# Patient Record
Sex: Female | Born: 1955
Health system: Southern US, Community
[De-identification: ages and names within clinical notes are randomized; demographics above are authoritative.]

## PROBLEM LIST (undated history)

## (undated) DIAGNOSIS — I6529 Occlusion and stenosis of unspecified carotid artery: Secondary | ICD-10-CM

## (undated) DIAGNOSIS — N951 Menopausal and female climacteric states: Secondary | ICD-10-CM

## (undated) DIAGNOSIS — Z8673 Personal history of transient ischemic attack (TIA), and cerebral infarction without residual deficits: Secondary | ICD-10-CM

## (undated) DIAGNOSIS — N200 Calculus of kidney: Secondary | ICD-10-CM

## (undated) DIAGNOSIS — Z8742 Personal history of other diseases of the female genital tract: Secondary | ICD-10-CM

## (undated) DIAGNOSIS — M791 Myalgia, unspecified site: Secondary | ICD-10-CM

## (undated) DIAGNOSIS — M85851 Other specified disorders of bone density and structure, right thigh: Secondary | ICD-10-CM

## (undated) DIAGNOSIS — K589 Irritable bowel syndrome without diarrhea: Secondary | ICD-10-CM

## (undated) DIAGNOSIS — E559 Vitamin D deficiency, unspecified: Secondary | ICD-10-CM

## (undated) DIAGNOSIS — R740 Nonspecific elevation of levels of transaminase and lactic acid dehydrogenase [LDH]: Secondary | ICD-10-CM

## (undated) DIAGNOSIS — R112 Nausea with vomiting, unspecified: Secondary | ICD-10-CM

## (undated) DIAGNOSIS — K219 Gastro-esophageal reflux disease without esophagitis: Secondary | ICD-10-CM

## (undated) DIAGNOSIS — K760 Fatty (change of) liver, not elsewhere classified: Secondary | ICD-10-CM

## (undated) DIAGNOSIS — E119 Type 2 diabetes mellitus without complications: Secondary | ICD-10-CM

## (undated) DIAGNOSIS — R9431 Abnormal electrocardiogram [ECG] [EKG]: Secondary | ICD-10-CM

## (undated) DIAGNOSIS — I10 Essential (primary) hypertension: Secondary | ICD-10-CM

## (undated) DIAGNOSIS — E781 Pure hyperglyceridemia: Secondary | ICD-10-CM

## (undated) DIAGNOSIS — E7800 Pure hypercholesterolemia, unspecified: Secondary | ICD-10-CM

## (undated) DIAGNOSIS — Z87442 Personal history of urinary calculi: Secondary | ICD-10-CM

## (undated) DIAGNOSIS — R7989 Other specified abnormal findings of blood chemistry: Secondary | ICD-10-CM

## (undated) DIAGNOSIS — R7309 Other abnormal glucose: Secondary | ICD-10-CM

## (undated) DIAGNOSIS — Z9889 Other specified postprocedural states: Secondary | ICD-10-CM

## (undated) DIAGNOSIS — N3010 Interstitial cystitis (chronic) without hematuria: Secondary | ICD-10-CM

## (undated) DIAGNOSIS — I700 Atherosclerosis of aorta: Secondary | ICD-10-CM

## (undated) DIAGNOSIS — T8859XA Other complications of anesthesia, initial encounter: Secondary | ICD-10-CM

## (undated) DIAGNOSIS — R160 Hepatomegaly, not elsewhere classified: Secondary | ICD-10-CM

## (undated) DIAGNOSIS — R079 Chest pain, unspecified: Secondary | ICD-10-CM

## (undated) DIAGNOSIS — F419 Anxiety disorder, unspecified: Secondary | ICD-10-CM

## (undated) DIAGNOSIS — R7401 Elevation of levels of liver transaminase levels: Secondary | ICD-10-CM

## (undated) DIAGNOSIS — L80 Vitiligo: Secondary | ICD-10-CM

## (undated) DIAGNOSIS — M2550 Pain in unspecified joint: Secondary | ICD-10-CM

## (undated) DIAGNOSIS — T4145XA Adverse effect of unspecified anesthetic, initial encounter: Secondary | ICD-10-CM

## (undated) HISTORY — DX: Irritable bowel syndrome, unspecified: K58.9

## (undated) HISTORY — DX: Elevation of levels of liver transaminase levels: R74.01

## (undated) HISTORY — DX: Pain in unspecified joint: M25.50

## (undated) HISTORY — DX: Other specified abnormal findings of blood chemistry: R79.89

## (undated) HISTORY — DX: Personal history of other diseases of the female genital tract: Z87.42

## (undated) HISTORY — DX: Myalgia, unspecified site: M79.10

## (undated) HISTORY — DX: Calculus of kidney: N20.0

## (undated) HISTORY — DX: Other abnormal glucose: R73.09

## (undated) HISTORY — DX: Pure hypercholesterolemia, unspecified: E78.00

## (undated) HISTORY — DX: Hepatomegaly, not elsewhere classified: R16.0

## (undated) HISTORY — DX: Vitiligo: L80

## (undated) HISTORY — DX: Personal history of transient ischemic attack (TIA), and cerebral infarction without residual deficits: Z86.73

## (undated) HISTORY — DX: Vitamin D deficiency, unspecified: E55.9

## (undated) HISTORY — DX: Other specified disorders of bone density and structure, right thigh: M85.851

## (undated) HISTORY — DX: Pure hyperglyceridemia: E78.1

## (undated) HISTORY — DX: Nonspecific elevation of levels of transaminase and lactic acid dehydrogenase (ldh): R74.0

## (undated) HISTORY — DX: Type 2 diabetes mellitus without complications: E11.9

## (undated) HISTORY — DX: Occlusion and stenosis of unspecified carotid artery: I65.29

## (undated) HISTORY — DX: Essential (primary) hypertension: I10

## (undated) HISTORY — PX: CATARACT EXTRACTION, BILATERAL: SHX1313

## (undated) HISTORY — DX: Abnormal electrocardiogram (ECG) (EKG): R94.31

## (undated) HISTORY — PX: ESOPHAGOGASTRODUODENOSCOPY: SHX1529

## (undated) HISTORY — DX: Interstitial cystitis (chronic) without hematuria: N30.10

## (undated) HISTORY — DX: Menopausal and female climacteric states: N95.1

## (undated) HISTORY — DX: Atherosclerosis of aorta: I70.0

## (undated) HISTORY — DX: Chest pain, unspecified: R07.9

## (undated) HISTORY — DX: Fatty (change of) liver, not elsewhere classified: K76.0

---

## 1982-10-04 HISTORY — PX: ABDOMINAL HYSTERECTOMY: SHX81

## 1999-02-06 ENCOUNTER — Ambulatory Visit (HOSPITAL_COMMUNITY): Admission: RE | Admit: 1999-02-06 | Discharge: 1999-02-06 | Payer: Self-pay | Admitting: Gastroenterology

## 1999-02-06 ENCOUNTER — Encounter: Payer: Self-pay | Admitting: Gastroenterology

## 1999-08-18 ENCOUNTER — Ambulatory Visit (HOSPITAL_COMMUNITY): Admission: RE | Admit: 1999-08-18 | Discharge: 1999-08-18 | Payer: Self-pay | Admitting: Obstetrics and Gynecology

## 1999-08-18 ENCOUNTER — Encounter: Payer: Self-pay | Admitting: Obstetrics and Gynecology

## 1999-08-31 ENCOUNTER — Other Ambulatory Visit: Admission: RE | Admit: 1999-08-31 | Discharge: 1999-08-31 | Payer: Self-pay | Admitting: Gynecology

## 2000-08-19 ENCOUNTER — Encounter: Payer: Self-pay | Admitting: Obstetrics and Gynecology

## 2000-08-19 ENCOUNTER — Ambulatory Visit (HOSPITAL_COMMUNITY): Admission: RE | Admit: 2000-08-19 | Discharge: 2000-08-19 | Payer: Self-pay | Admitting: Obstetrics and Gynecology

## 2000-08-29 ENCOUNTER — Other Ambulatory Visit: Admission: RE | Admit: 2000-08-29 | Discharge: 2000-08-29 | Payer: Self-pay | Admitting: Gynecology

## 2001-03-30 ENCOUNTER — Ambulatory Visit (HOSPITAL_COMMUNITY): Admission: RE | Admit: 2001-03-30 | Discharge: 2001-03-30 | Payer: Self-pay | Admitting: Gastroenterology

## 2001-08-25 ENCOUNTER — Encounter: Payer: Self-pay | Admitting: Gynecology

## 2001-08-25 ENCOUNTER — Ambulatory Visit (HOSPITAL_COMMUNITY): Admission: RE | Admit: 2001-08-25 | Discharge: 2001-08-25 | Payer: Self-pay | Admitting: Gynecology

## 2001-09-05 ENCOUNTER — Other Ambulatory Visit: Admission: RE | Admit: 2001-09-05 | Discharge: 2001-09-05 | Payer: Self-pay | Admitting: Gynecology

## 2001-10-12 ENCOUNTER — Encounter: Payer: Self-pay | Admitting: Gastroenterology

## 2001-10-12 ENCOUNTER — Encounter: Admission: RE | Admit: 2001-10-12 | Discharge: 2001-10-12 | Payer: Self-pay | Admitting: Gastroenterology

## 2002-01-18 ENCOUNTER — Ambulatory Visit (HOSPITAL_BASED_OUTPATIENT_CLINIC_OR_DEPARTMENT_OTHER): Admission: RE | Admit: 2002-01-18 | Discharge: 2002-01-18 | Payer: Self-pay | Admitting: Gynecology

## 2002-03-02 ENCOUNTER — Encounter: Payer: Self-pay | Admitting: Surgery

## 2002-03-02 ENCOUNTER — Emergency Department (HOSPITAL_COMMUNITY): Admission: EM | Admit: 2002-03-02 | Discharge: 2002-03-02 | Payer: Self-pay | Admitting: Emergency Medicine

## 2002-08-27 ENCOUNTER — Ambulatory Visit (HOSPITAL_COMMUNITY): Admission: RE | Admit: 2002-08-27 | Discharge: 2002-08-27 | Payer: Self-pay | Admitting: Gynecology

## 2002-08-27 ENCOUNTER — Encounter: Payer: Self-pay | Admitting: Gynecology

## 2002-09-03 ENCOUNTER — Other Ambulatory Visit: Admission: RE | Admit: 2002-09-03 | Discharge: 2002-09-03 | Payer: Self-pay | Admitting: Gynecology

## 2002-11-30 ENCOUNTER — Encounter: Admission: RE | Admit: 2002-11-30 | Discharge: 2002-11-30 | Payer: Self-pay | Admitting: Gastroenterology

## 2002-11-30 ENCOUNTER — Encounter: Payer: Self-pay | Admitting: Gastroenterology

## 2002-12-07 ENCOUNTER — Ambulatory Visit (HOSPITAL_COMMUNITY): Admission: RE | Admit: 2002-12-07 | Discharge: 2002-12-07 | Payer: Self-pay | Admitting: Gastroenterology

## 2002-12-07 ENCOUNTER — Encounter (INDEPENDENT_AMBULATORY_CARE_PROVIDER_SITE_OTHER): Payer: Self-pay | Admitting: Specialist

## 2003-09-02 ENCOUNTER — Other Ambulatory Visit: Admission: RE | Admit: 2003-09-02 | Discharge: 2003-09-02 | Payer: Self-pay | Admitting: Gynecology

## 2003-09-02 ENCOUNTER — Ambulatory Visit (HOSPITAL_COMMUNITY): Admission: RE | Admit: 2003-09-02 | Discharge: 2003-09-02 | Payer: Self-pay | Admitting: Gynecology

## 2003-10-31 ENCOUNTER — Encounter: Admission: RE | Admit: 2003-10-31 | Discharge: 2003-10-31 | Payer: Self-pay | Admitting: Gastroenterology

## 2004-09-14 ENCOUNTER — Other Ambulatory Visit: Admission: RE | Admit: 2004-09-14 | Discharge: 2004-09-14 | Payer: Self-pay | Admitting: Gynecology

## 2004-09-14 ENCOUNTER — Ambulatory Visit (HOSPITAL_COMMUNITY): Admission: RE | Admit: 2004-09-14 | Discharge: 2004-09-14 | Payer: Self-pay | Admitting: Gynecology

## 2005-09-15 ENCOUNTER — Ambulatory Visit (HOSPITAL_COMMUNITY): Admission: RE | Admit: 2005-09-15 | Discharge: 2005-09-15 | Payer: Self-pay | Admitting: Gynecology

## 2005-09-15 ENCOUNTER — Other Ambulatory Visit: Admission: RE | Admit: 2005-09-15 | Discharge: 2005-09-15 | Payer: Self-pay | Admitting: Gynecology

## 2005-10-19 ENCOUNTER — Encounter: Admission: RE | Admit: 2005-10-19 | Discharge: 2005-10-19 | Payer: Self-pay | Admitting: Gastroenterology

## 2005-12-14 ENCOUNTER — Encounter: Admission: RE | Admit: 2005-12-14 | Discharge: 2005-12-14 | Payer: Self-pay | Admitting: Gastroenterology

## 2006-09-15 ENCOUNTER — Other Ambulatory Visit: Admission: RE | Admit: 2006-09-15 | Discharge: 2006-09-15 | Payer: Self-pay | Admitting: Internal Medicine

## 2006-09-16 ENCOUNTER — Encounter: Admission: RE | Admit: 2006-09-16 | Discharge: 2006-09-16 | Payer: Self-pay | Admitting: Gastroenterology

## 2007-09-19 ENCOUNTER — Ambulatory Visit (HOSPITAL_COMMUNITY): Admission: RE | Admit: 2007-09-19 | Discharge: 2007-09-19 | Payer: Self-pay | Admitting: Obstetrics and Gynecology

## 2007-09-22 ENCOUNTER — Other Ambulatory Visit: Admission: RE | Admit: 2007-09-22 | Discharge: 2007-09-22 | Payer: Self-pay | Admitting: Obstetrics and Gynecology

## 2008-09-19 ENCOUNTER — Ambulatory Visit (HOSPITAL_COMMUNITY): Admission: RE | Admit: 2008-09-19 | Discharge: 2008-09-19 | Payer: Self-pay | Admitting: Gynecology

## 2008-10-11 ENCOUNTER — Other Ambulatory Visit: Admission: RE | Admit: 2008-10-11 | Discharge: 2008-10-11 | Payer: Self-pay | Admitting: Obstetrics and Gynecology

## 2009-09-24 ENCOUNTER — Ambulatory Visit (HOSPITAL_COMMUNITY): Admission: RE | Admit: 2009-09-24 | Discharge: 2009-09-24 | Payer: Self-pay | Admitting: Gynecology

## 2009-10-16 ENCOUNTER — Other Ambulatory Visit: Admission: RE | Admit: 2009-10-16 | Discharge: 2009-10-16 | Payer: Self-pay | Admitting: Obstetrics and Gynecology

## 2009-12-15 ENCOUNTER — Encounter: Admission: RE | Admit: 2009-12-15 | Discharge: 2009-12-15 | Payer: Self-pay | Admitting: Gastroenterology

## 2010-03-20 ENCOUNTER — Encounter: Admission: RE | Admit: 2010-03-20 | Discharge: 2010-03-20 | Payer: Self-pay | Admitting: Gastroenterology

## 2010-09-29 ENCOUNTER — Ambulatory Visit (HOSPITAL_COMMUNITY)
Admission: RE | Admit: 2010-09-29 | Discharge: 2010-09-29 | Payer: Self-pay | Source: Home / Self Care | Attending: Obstetrics and Gynecology | Admitting: Obstetrics and Gynecology

## 2010-10-25 ENCOUNTER — Encounter: Payer: Self-pay | Admitting: Gynecology

## 2011-02-19 NOTE — Op Note (Signed)
NAME:  Annette Bartlett, Annette Bartlett                          ACCOUNT NO.:  [id number]   MEDICAL RECORD NO.:  [id number]                   PATIENT TYPE:  AMB   LOCATION:  ENDO                                 FACILITY:  [facility]   PHYSICIAN:  Danise Edge, M.D.                DATE OF BIRTH:  Nov 19, 1955   DATE OF PROCEDURE:  12/07/2002  DATE OF DISCHARGE:                                 OPERATIVE REPORT   PROCEDURE:  Colonoscopy.   INDICATIONS:  The patient is a 55 year old female born 2056-07-03.  The patient  has unexplained left-sided abdominal pain associated with chronic irritable  bowel syndrome and chronic interstitial cystitis.   The patient has received a number of courses of Keflex and Cipro attempting  to eradicate her staphylococcal cystitis.  She was recently seen in  the  emergency room by Molli Hazard B. Daphine Deutscher, M.D., who suspected that she might have  nephrolithiasis.  She was seen in consultation by Veverly Fells. Vernie Ammons, M.D.  The  patient tells me that her renal ultrasound did not show kidney stones.  The  ultrasound did show hydronephrosis of the right kidney.   The patient has episodes of nausea, epigastric discomfort, left-sided  abdominal discomfort, and three to four episodes of watery, nonbloody  diarrhea during the daytime without nocturnal diarrhea.   March 1989, air-contrast barium enema was normal.  January 1992, small bowel  follow-through x-ray series was normal.  February 1992 diagnostic  laparoscopy performed by Gretta Cool, M.D., was normal, status post  total abdominal hysterectomy-bilateral salpingo-oophorectomy, which was  performed for endometriosis.   February 1998, intravenous pyelogram was normal.  April 1998, CT scan of the  abdomen was normal.  May 1998, proctocolonoscopy to the cecum was normal.  June 2002, esophagogastroduodenoscopy was normal.  February 2002, cardiac  evaluation was normal.  January 2003, CT scan of the abdomen and pelvis was  normal.   The patient continues on Keflex; her last urine culture showed 25,000  colonies of Staphylococcus.   MEDICATION ALLERGIES:  SULFA.   PAST MEDICAL HISTORY:  1. Total abdominal hysterectomy with BSO for endometriosis.  2. Normal diagnostic laparoscopy in 1992.  3. Chronic irritable bowel syndrome.  4. Chronic interstitial cystitis.  5. CT scan of the abdomen and pelvis performed 11/30/02 revealed resolution     of the right renal hydronephrosis, otherwise normal exam post TAH-BSO.   ENDOSCOPIST:  Danise Edge, M.D.   PREMEDICATION:  Versed 10 mg, Demerol 100 mg.   ENDOSCOPE:  Olympus pediatric colonoscope.   DESCRIPTION OF PROCEDURE:  After obtaining informed consent, the patient was  placed in the left lateral decubitus position.  I administered intravenous  Demerol and intravenous Versed to achieve conscious sedation for the  procedure.  The patient's blood pressure, oxygen saturation, and cardiac  rhythm were monitored throughout the procedure and documented in the medical  record.   Anal inspection was normal.  Digital rectal exam was normal.  The Olympus  pediatric video colonoscope was introduced into the rectum and easily  advanced to the cecum.  Colonic preparation for the exam today was  excellent.   Rectum:  Two 0.5 mm diminutive smooth polyps were removed from the distal  rectum with the cold biopsy forceps.   Sigmoid colon and descending colon normal.   Splenic flexure normal.   Transverse colon normal.   Hepatic flexure normal.   Ascending colon normal.   Cecum and ileocecal valve normal.   Random colonic biopsies:  Three biopsies were taken from the right colon and  three biopsies were taken from the left colon.  All biopsies were submitted  in one bottle to rule out collagenous-microscopic colitis.   ASSESSMENT:  1. Two diminutive polyps were removed from the distal rectum.  2. Random colonic biopsies to rule out microscopic-collagenous colitis      pending.  3. Otherwise normal proctocolonoscopy to the cecum without signs of     inflammatory bowel disease, colorectal neoplasia.                                               Danise Edge, M.D.    MJ/MEDQ  D:  12/07/2002  T:  12/08/2002  Job:  [id number]   cc:   Veverly Fells. Vernie Ammons, M.D.  [address]  [place]  [place] [address]  Fax: [phone]   Gretta Cool, M.D.  [address] [place]  [place] [address]  Fax: [phone]   [place]. Daphine Deutscher, M.D.  [address]  [place]  [place] [address]  Fax: [phone]   [address], [place] [facility]

## 2011-02-19 NOTE — Op Note (Signed)
[facility]  Patient:    Annette Bartlett, Annette Bartlett Visit Number: [id number] MRN: [id number]          Service Type: NES Location: [facility] Attending Physician:  Katrina Stack Dictated by:   Gretta Cool, M.D. Proc. Date: 01/18/02 Admit Date:  01/18/2002                             Operative Report  PREOPERATIVE DIAGNOSIS: 1. Incapacitating pelvic pain, noncyclic, with history of irritable bowel    syndrome and interstitial cystitis. 2. Stage III endometriosis, post total abdominal hysterectomy and    bilateral salpingo-oophorectomy and second-look laparoscopy in 1992.  POSTOPERATIVE DIAGNOSIS:  No evidence of source of pelvic pain except possible small sigmoid adhesion to the left lateral pelvic wall at the pelvic brim.  PROCEDURES: 1. Diagnostic laparoscopy, surveillance of the entire peritoneal cavity,    photographs of the pelvis and upper abdomen. 2. lysis of sigmoid adhesion to the site of resection of round ligament.  SURGEON:  Gretta Cool, M.D.  DESCRIPTION OF PROCEDURE:  Under excellent general orotracheal anesthesia with the patients abdomen prepped and draped as a sterile field with her bladder drained, a subumbilical incision was made and the Veress cannula introduced. After adequate pneumoperitoneum with carbon dioxide, laparoscope trocar was introduced and pelvic organs visualized.  Accessory port sites were placed far lateral in the abdomen under direct vision without complication.  The port sites were then used for retraction to help and aid in the visualization of the entire pelvis and upper abdomen.  There were no abnormalities identified except for some adhesion of the ascending colon above the pelvic brim to near the base of the liver.  The adhesions appeared to be very filmy and had no likely association with her left lower quadrant pain.  On the left she had an adhesion of the sigmoid colon to the area of the left round  ligament resection with tenting of the sigmoid colon.  The adhesion was very filmy and relatively avascular.  It was resected and to allow the colon to fall away to the pelvic brim.  There was no evidence of residual endometriosis or other intra-abdominal pathology.  The cecum was normal.  The appendix could not be visualized.  The distal ileum appeared normal.  At this point the area of resection of adhesions was examined again, and there was no significant bleeding.  The procedure was then terminated without complication.  The patient returned to the recovery room in excellent condition.  Note the incisions were closed with deep suture of 5-0 Vicryl and skin closed with Steri-Strips. Dictated by:   Gretta Cool, M.D. Attending Physician:  Katrina Stack DD:  01/18/02 TD:  01/19/02 Job: [id number]

## 2011-02-19 NOTE — Procedures (Signed)
[facility]  Patient:    Annette Bartlett, Annette Bartlett                         MRN: [id number] Proc. Date: 03/30/01 Attending:  Verlin Grills, M.D. CC:         Armanda Magic, M.D.   Procedure Report  PROCEDURE:  Esophagogastroduodenoscopy.  REFERRING PHYSICIAN:  Armanda Magic, M.D.  INDICATIONS FOR PROCEDURE:  The patient (date of birth 06/18/1956) is a 55 year old female having bouts of "atypical" anterior chest pain unassociated with dysphagia, odynophagia, or heartburn.  In 1991, her cardiac echo suggested mitral valve prolapse.  In 2000, her cardiac echo did not reveal mitral valve prolapse.  ENDOSCOPIST:  Verlin Grills, M.D.  PREMEDICATION:  Versed 10 mg and Demerol 50 mg.  ENDOSCOPE:  Olympus gastroscope.  DESCRIPTION OF PROCEDURE:  After obtaining informed consent, the patient was placed in the left lateral decubitus position.  I administered intravenous Demerol and intravenous Versed to achieve conscious sedation for the procedure.  The patients blood pressure, oxygen saturation, and cardiac rhythm were monitored throughout the procedure and documented in the medical record.  The Olympus gastroscope was passed through the posterior hypopharynx into the proximal esophagus without difficulty.  The hypopharynx, larynx, and vocal cords appeared normal  Esophagoscopy:  The proximal, mid, and lower segment of the esophagus appeared completely normal.  The squamocolumnar junction and the esophagogastric junction are noted at approximately 40 cm from the incisor teeth. Endoscopically, there is no evidence for the presence of Barretts esophagus, erosive esophagitis, esophageal mucosal scarring, or esophageal ulceration.  Gastroscopy:  Retroflexed view of the gastric cardia and fundus was normal. The gastric body, antrum, and pylorus appeared normal.  Duodenoscopy:  The duodenal bulb, mid duodenum, and distal duodenum  appeared normal.  ASSESSMENT:  Normal esophagogastroduodenoscopy.  RECOMMENDATIONS:  If the patient is having bouts of chest pain due to "esophageal spasm", I would recommend p.r.n. sublingual nitroglycerin to control her bouts of chest pain.  I see no good indication for further gastrointestinal evaluation. DD:  03/30/01 TD:  03/30/01 Job: [id number]

## 2011-07-29 ENCOUNTER — Other Ambulatory Visit: Payer: Self-pay | Admitting: Internal Medicine

## 2011-07-30 ENCOUNTER — Ambulatory Visit
Admission: RE | Admit: 2011-07-30 | Discharge: 2011-07-30 | Disposition: A | Discharge status: Home / Self Care | Payer: PRIVATE HEALTH INSURANCE | Source: Ambulatory Visit | Attending: Internal Medicine | Admitting: Internal Medicine

## 2011-07-30 MED ORDER — IOHEXOL 300 MG/ML  SOLN: 75.0000 mL | Freq: Once | INTRAMUSCULAR | Status: AC | PRN

## 2011-08-24 ENCOUNTER — Other Ambulatory Visit (HOSPITAL_COMMUNITY): Payer: Self-pay | Admitting: Internal Medicine

## 2011-08-24 DIAGNOSIS — Z1231 Encounter for screening mammogram for malignant neoplasm of breast: Secondary | ICD-10-CM

## 2011-10-05 DIAGNOSIS — N200 Calculus of kidney: Secondary | ICD-10-CM

## 2011-10-05 HISTORY — DX: Calculus of kidney: N20.0

## 2011-10-07 ENCOUNTER — Ambulatory Visit (HOSPITAL_COMMUNITY)
Admission: RE | Admit: 2011-10-07 | Discharge: 2011-10-07 | Disposition: A | Discharge status: Home / Self Care | Payer: PRIVATE HEALTH INSURANCE | Source: Ambulatory Visit | Attending: Internal Medicine | Admitting: Internal Medicine

## 2011-10-07 DIAGNOSIS — Z1231 Encounter for screening mammogram for malignant neoplasm of breast: Secondary | ICD-10-CM

## 2012-03-30 ENCOUNTER — Other Ambulatory Visit: Payer: Self-pay | Admitting: Internal Medicine

## 2012-03-30 DIAGNOSIS — R109 Unspecified abdominal pain: Secondary | ICD-10-CM

## 2012-04-04 ENCOUNTER — Ambulatory Visit
Admission: RE | Admit: 2012-04-04 | Discharge: 2012-04-04 | Disposition: A | Discharge status: Home / Self Care | Payer: BC Managed Care – PPO | Source: Ambulatory Visit | Attending: Internal Medicine | Admitting: Internal Medicine

## 2012-04-04 DIAGNOSIS — R109 Unspecified abdominal pain: Secondary | ICD-10-CM

## 2012-04-04 MED ORDER — IOHEXOL 300 MG/ML  SOLN
100.0000 mL | Freq: Once | INTRAMUSCULAR | Status: AC | PRN
  Administered 2012-04-04: 100 mL via INTRAVENOUS

## 2012-06-01 ENCOUNTER — Other Ambulatory Visit: Payer: Self-pay | Admitting: Gastroenterology

## 2012-06-01 DIAGNOSIS — R1906 Epigastric swelling, mass or lump: Secondary | ICD-10-CM

## 2012-06-07 ENCOUNTER — Other Ambulatory Visit: Payer: Self-pay | Admitting: Gastroenterology

## 2012-06-07 ENCOUNTER — Ambulatory Visit
Admission: RE | Admit: 2012-06-07 | Discharge: 2012-06-07 | Disposition: A | Discharge status: Home / Self Care | Payer: BC Managed Care – PPO | Source: Ambulatory Visit | Attending: Gastroenterology | Admitting: Gastroenterology

## 2012-06-07 DIAGNOSIS — R1906 Epigastric swelling, mass or lump: Secondary | ICD-10-CM

## 2012-09-07 ENCOUNTER — Other Ambulatory Visit (HOSPITAL_COMMUNITY): Payer: Self-pay | Admitting: Internal Medicine

## 2012-09-07 DIAGNOSIS — Z1231 Encounter for screening mammogram for malignant neoplasm of breast: Secondary | ICD-10-CM

## 2012-10-10 ENCOUNTER — Ambulatory Visit (HOSPITAL_COMMUNITY)
Admission: RE | Admit: 2012-10-10 | Discharge: 2012-10-10 | Disposition: A | Discharge status: Home / Self Care | Payer: BC Managed Care – PPO | Source: Ambulatory Visit | Attending: Internal Medicine | Admitting: Internal Medicine

## 2012-10-10 DIAGNOSIS — Z1231 Encounter for screening mammogram for malignant neoplasm of breast: Secondary | ICD-10-CM | POA: Insufficient documentation

## 2013-06-11 ENCOUNTER — Other Ambulatory Visit (HOSPITAL_COMMUNITY)
Admission: RE | Admit: 2013-06-11 | Discharge: 2013-06-11 | Disposition: A | Discharge status: Home / Self Care | Payer: BC Managed Care – PPO | Source: Ambulatory Visit | Attending: Internal Medicine | Admitting: Internal Medicine

## 2013-06-11 ENCOUNTER — Other Ambulatory Visit: Payer: Self-pay | Admitting: Internal Medicine

## 2013-06-11 DIAGNOSIS — Z01419 Encounter for gynecological examination (general) (routine) without abnormal findings: Secondary | ICD-10-CM | POA: Insufficient documentation

## 2013-08-04 DIAGNOSIS — Z87442 Personal history of urinary calculi: Secondary | ICD-10-CM

## 2013-08-04 HISTORY — DX: Personal history of urinary calculi: Z87.442

## 2013-08-08 ENCOUNTER — Other Ambulatory Visit: Payer: Self-pay | Admitting: Urology

## 2013-08-13 ENCOUNTER — Encounter (HOSPITAL_COMMUNITY): Payer: Self-pay | Admitting: Pharmacy Technician

## 2013-08-14 ENCOUNTER — Encounter (HOSPITAL_COMMUNITY): Payer: Self-pay | Admitting: *Deleted

## 2013-08-17 NOTE — H&P (Signed)
Annette Bartlett is a 57 year old female patient with a right renal calculus.   History of Present Illness Interstitial cystitis: She was diagnosed with this in 4/98 at which time in office cystoscopy revealed a 300 cc capacity bladder and bloody terminal efluent. She was treated with intravesical therapy over the years.    Right nephrolithiasis: She had no stones seen on a CT scan in 6/03 and a 1 mm stone in the lower pole of the right kidney on CT scan in 2004 and 2005. In 7/14 a CT scan 700.revealed a 4 x 5 mm stone in the right renal pelvis with Hounsfield units of ~ 700.    Interval hx: About one week ago she began to have gross hematuria. This was associated with some discomfort in the right flank region. It was not associated with any nausea or vomiting. She has not seen any further hematuria and tells me that she is still experiencing some discomfort on the right side. She has not seen a stone pass. Her pain is not relieved by positional change. No modifying factors or other associated signs and symptoms.     Past Medical History Problems  1. History of Anxiety (300.00) 2. History of Chronic interstitial cystitis without hematuria (595.1) 3. History of hypertension (V12.59) 4. History of irritable bowel syndrome (V12.79)  Surgical History Problems  1. History of Hysterectomy  Current Meds 1. Escitalopram Oxalate 10 MG Oral Tablet;  Therapy: 13Nov2013 to Recorded 2. Premarin 0.45 MG Oral Tablet;  Therapy: 05Jul2012 to Recorded 3. Rapaflo 8 MG Oral Capsule; TAKE 1 CAPSULE DAILY WITH FOOD;  Therapy: 15Aug2014 to (Evaluate:14Oct2014); Last Rx:15Aug2014 Ordered 4. TraZODone HCl - 50 MG Oral Tablet;  Therapy: 28Jun2012 to Recorded  Allergies Medication  1. Macrobid CAPS 2. Sulfa Drugs  Family History Problems  1. Family history of Blood In Urine : Son 2. Family history of Breast Cancer (V16.3) : Mother 3. Family history of Family Health Status - Father's Age   age 52 4.  Family history of Family Health Status - Mother's Age   age 90 5. Family history of Family Health Status Number Of Children   1 son 6. Family history of Hypertension (V17.49) 7. Family history of Parkinson's Disease : Father 8. Family history of Prostate Cancer (A54.09) : Father 82. Family history of Stroke Syndrome (V17.1) : Brother  Social History Problems  1. Denied: History of Alcohol Use 2. Caffeine Use   2 per day 3. Marital History - Widowed 4. Never A Smoker 5. Occupation:   Medical Records  Review of Systems Genitourinary, constitutional, skin, eye, otolaryngeal, hematologic/lymphatic, cardiovascular, pulmonary, endocrine, musculoskeletal, gastrointestinal, neurological and psychiatric system(s) were reviewed and pertinent findings if present are noted.  Genitourinary: hematuria.  Gastrointestinal: flank pain.  Constitutional: no fever.    Vitals Vital Signs   Height: 5 ft 4 in Weight: 132 lb  BMI Calculated: 22.66 BSA Calculated: 1.64 Blood Pressure: 129 / 69 Temperature: 97.3 F Heart Rate: 83  Physical Exam Constitutional: Well nourished and well developed . No acute distress. The patient appears well hydrated.  ENT:. The ears and nose are normal in appearance.  Neck: The appearance of the neck is normal.  Pulmonary: No respiratory distress.  Cardiovascular: Heart rate and rhythm are normal.  Abdomen: The abdomen is flat. The abdomen is soft and nontender. No suprapubic tenderness, no tenderness in the RLQ and no LLQ tenderness. No CVA tenderness. No hernias are palpable.  Genitourinary:. Deferred.  Skin: Normal skin turgor and normal  skin color and pigmentation.  Neuro/Psych:. Mood and affect are appropriate.   Assessment Assessed  1. Right ureteral calculus (592.1)  Her stone is again seen on KUB located at the UPJ on the right-hand side. We therefore discussed surgical management. I discussed ureteroscopy and lithotripsy with her in detail and we  went over the pluses and minuses of each of these procedures. The stones location and density would lend itself to lithotripsy nicely and I therefore went over that procedure with her including the probability of success, the outpatient nature of the procedure, anticipated postoperative course and the risks and complications. She understands and has elected to proceed. I am going to continue her on medical expulsive therapy.     Plan   1. Continue medical expulsive therapy with tamsulosin.  2. She will be scheduled for lithotripsy.

## 2013-08-20 ENCOUNTER — Ambulatory Visit (HOSPITAL_COMMUNITY): Payer: BC Managed Care – PPO

## 2013-08-20 ENCOUNTER — Ambulatory Visit (HOSPITAL_COMMUNITY)
Admission: RE | Admit: 2013-08-20 | Discharge: 2013-08-20 | Disposition: A | Discharge status: Home / Self Care | Payer: BC Managed Care – PPO | Source: Ambulatory Visit | Attending: Urology | Admitting: Urology

## 2013-08-20 ENCOUNTER — Encounter (HOSPITAL_COMMUNITY): Payer: Self-pay | Admitting: *Deleted

## 2013-08-20 ENCOUNTER — Encounter (HOSPITAL_COMMUNITY)
Admission: RE | Disposition: A | Discharge status: Home / Self Care | Payer: Self-pay | Source: Ambulatory Visit | Attending: Urology

## 2013-08-20 DIAGNOSIS — N200 Calculus of kidney: Secondary | ICD-10-CM | POA: Diagnosis present

## 2013-08-20 DIAGNOSIS — I10 Essential (primary) hypertension: Secondary | ICD-10-CM | POA: Insufficient documentation

## 2013-08-20 HISTORY — DX: Other complications of anesthesia, initial encounter: T88.59XA

## 2013-08-20 HISTORY — DX: Adverse effect of unspecified anesthetic, initial encounter: T41.45XA

## 2013-08-20 HISTORY — DX: Anxiety disorder, unspecified: F41.9

## 2013-08-20 HISTORY — DX: Other specified postprocedural states: R11.2

## 2013-08-20 HISTORY — DX: Other specified postprocedural states: Z98.890

## 2013-08-20 HISTORY — DX: Personal history of urinary calculi: Z87.442

## 2013-08-20 SURGERY — LITHOTRIPSY, ESWL
Anesthesia: LOCAL | Laterality: Right

## 2013-08-20 MED ORDER — DIAZEPAM 5 MG PO TABS
10.0000 mg | ORAL_TABLET | ORAL | Status: AC
  Administered 2013-08-20: 10 mg via ORAL
  Filled 2013-08-20: qty 2

## 2013-08-20 MED ORDER — TAMSULOSIN HCL 0.4 MG PO CAPS: 0.4000 mg | ORAL_CAPSULE | ORAL | Status: DC

## 2013-08-20 MED ORDER — OXYCODONE-ACETAMINOPHEN 10-325 MG PO TABS: 1.0000 | ORAL_TABLET | ORAL | Status: DC | PRN

## 2013-08-20 MED ORDER — DIPHENHYDRAMINE HCL 25 MG PO CAPS
25.0000 mg | ORAL_CAPSULE | ORAL | Status: AC
  Administered 2013-08-20: 25 mg via ORAL
  Filled 2013-08-20: qty 1

## 2013-08-20 MED ORDER — CIPROFLOXACIN HCL 500 MG PO TABS
500.0000 mg | ORAL_TABLET | ORAL | Status: AC
  Administered 2013-08-20: 500 mg via ORAL
  Filled 2013-08-20: qty 1

## 2013-08-20 MED ORDER — TAMSULOSIN HCL 0.4 MG PO CAPS: 0.4000 mg | ORAL_CAPSULE | Freq: Once | ORAL | Status: DC

## 2013-08-20 MED ORDER — SODIUM CHLORIDE 0.9 % IV SOLN
INTRAVENOUS | Status: DC
  Administered 2013-08-20: 07:00:00 via INTRAVENOUS

## 2013-08-20 NOTE — Interval H&P Note (Signed)
History and Physical Interval Note:  08/20/2013 7:39 AM  Annette Bartlett  has presented today for surgery, with the diagnosis of RIGHT UPJ STONE   The various methods of treatment have been discussed with the patient and family. After consideration of risks, benefits and other options for treatment, the patient has consented to  Procedure(s): RIGHT EXTRACORPOREAL SHOCK WAVE LITHOTRIPSY RIGHT (ESWL) (Right) as a surgical intervention .  The patient's history has been reviewed, patient examined, no change in status, stable for surgery.  I have reviewed the patient's chart and labs.  Questions were answered to the patient's satisfaction.     Garnett Farm

## 2013-08-20 NOTE — Op Note (Signed)
See [facility] OP note scanned into chart. 

## 2013-09-06 ENCOUNTER — Other Ambulatory Visit (HOSPITAL_COMMUNITY): Payer: Self-pay | Admitting: Internal Medicine

## 2013-09-06 DIAGNOSIS — Z1231 Encounter for screening mammogram for malignant neoplasm of breast: Secondary | ICD-10-CM

## 2013-10-11 ENCOUNTER — Ambulatory Visit (HOSPITAL_COMMUNITY)
Admission: RE | Admit: 2013-10-11 | Discharge: 2013-10-11 | Disposition: A | Discharge status: Home / Self Care | Payer: BC Managed Care – PPO | Source: Ambulatory Visit | Attending: Internal Medicine | Admitting: Internal Medicine

## 2013-10-11 DIAGNOSIS — Z1231 Encounter for screening mammogram for malignant neoplasm of breast: Secondary | ICD-10-CM | POA: Insufficient documentation

## 2014-07-16 ENCOUNTER — Encounter: Payer: Self-pay | Admitting: *Deleted

## 2014-09-30 ENCOUNTER — Other Ambulatory Visit (HOSPITAL_COMMUNITY): Payer: Self-pay | Admitting: Internal Medicine

## 2014-09-30 DIAGNOSIS — Z1231 Encounter for screening mammogram for malignant neoplasm of breast: Secondary | ICD-10-CM

## 2014-10-31 ENCOUNTER — Ambulatory Visit (HOSPITAL_COMMUNITY)
Admission: RE | Admit: 2014-10-31 | Discharge: 2014-10-31 | Disposition: A | Discharge status: Home / Self Care | Payer: BLUE CROSS/BLUE SHIELD | Source: Ambulatory Visit | Attending: Internal Medicine | Admitting: Internal Medicine

## 2014-10-31 DIAGNOSIS — Z1231 Encounter for screening mammogram for malignant neoplasm of breast: Secondary | ICD-10-CM | POA: Diagnosis present

## 2015-01-02 ENCOUNTER — Other Ambulatory Visit: Payer: Self-pay | Admitting: Internal Medicine

## 2015-01-02 ENCOUNTER — Ambulatory Visit
Admission: RE | Admit: 2015-01-02 | Discharge: 2015-01-02 | Disposition: A | Discharge status: Home / Self Care | Payer: BLUE CROSS/BLUE SHIELD | Source: Ambulatory Visit | Attending: Internal Medicine | Admitting: Internal Medicine

## 2015-01-02 DIAGNOSIS — R319 Hematuria, unspecified: Secondary | ICD-10-CM

## 2015-06-18 ENCOUNTER — Ambulatory Visit
Admission: RE | Admit: 2015-06-18 | Discharge: 2015-06-18 | Disposition: A | Discharge status: Home / Self Care | Payer: BLUE CROSS/BLUE SHIELD | Source: Ambulatory Visit | Attending: Internal Medicine | Admitting: Internal Medicine

## 2015-06-18 ENCOUNTER — Other Ambulatory Visit: Payer: Self-pay | Admitting: Internal Medicine

## 2015-06-18 DIAGNOSIS — R079 Chest pain, unspecified: Secondary | ICD-10-CM

## 2015-10-01 ENCOUNTER — Other Ambulatory Visit: Payer: Self-pay

## 2015-10-01 DIAGNOSIS — Z1231 Encounter for screening mammogram for malignant neoplasm of breast: Secondary | ICD-10-CM

## 2015-10-22 ENCOUNTER — Other Ambulatory Visit: Payer: Self-pay | Admitting: Internal Medicine

## 2015-10-22 ENCOUNTER — Ambulatory Visit
Admission: RE | Admit: 2015-10-22 | Discharge: 2015-10-22 | Disposition: A | Discharge status: Home / Self Care | Payer: BLUE CROSS/BLUE SHIELD | Source: Ambulatory Visit | Attending: Internal Medicine | Admitting: Internal Medicine

## 2015-10-22 DIAGNOSIS — R079 Chest pain, unspecified: Secondary | ICD-10-CM

## 2015-11-05 ENCOUNTER — Ambulatory Visit
Admission: RE | Admit: 2015-11-05 | Discharge: 2015-11-05 | Disposition: A | Discharge status: Home / Self Care | Payer: BLUE CROSS/BLUE SHIELD | Source: Ambulatory Visit

## 2015-11-05 DIAGNOSIS — Z1231 Encounter for screening mammogram for malignant neoplasm of breast: Secondary | ICD-10-CM

## 2015-11-06 ENCOUNTER — Other Ambulatory Visit: Payer: Self-pay | Admitting: Gastroenterology

## 2015-12-17 ENCOUNTER — Encounter (HOSPITAL_COMMUNITY): Payer: Self-pay | Admitting: *Deleted

## 2015-12-23 ENCOUNTER — Ambulatory Visit (HOSPITAL_COMMUNITY): Payer: BLUE CROSS/BLUE SHIELD | Admitting: Certified Registered Nurse Anesthetist

## 2015-12-23 ENCOUNTER — Ambulatory Visit (HOSPITAL_COMMUNITY)
Admission: RE | Admit: 2015-12-23 | Discharge: 2015-12-23 | Disposition: A | Discharge status: Home / Self Care | Payer: BLUE CROSS/BLUE SHIELD | Source: Ambulatory Visit | Attending: Gastroenterology | Admitting: Gastroenterology

## 2015-12-23 ENCOUNTER — Encounter (HOSPITAL_COMMUNITY): Payer: Self-pay

## 2015-12-23 ENCOUNTER — Encounter (HOSPITAL_COMMUNITY)
Admission: RE | Disposition: A | Discharge status: Home / Self Care | Payer: Self-pay | Source: Ambulatory Visit | Attending: Gastroenterology

## 2015-12-23 DIAGNOSIS — R110 Nausea: Secondary | ICD-10-CM | POA: Insufficient documentation

## 2015-12-23 DIAGNOSIS — R109 Unspecified abdominal pain: Secondary | ICD-10-CM | POA: Insufficient documentation

## 2015-12-23 DIAGNOSIS — G8929 Other chronic pain: Secondary | ICD-10-CM | POA: Diagnosis not present

## 2015-12-23 DIAGNOSIS — K589 Irritable bowel syndrome without diarrhea: Secondary | ICD-10-CM | POA: Insufficient documentation

## 2015-12-23 DIAGNOSIS — Z1211 Encounter for screening for malignant neoplasm of colon: Secondary | ICD-10-CM | POA: Diagnosis present

## 2015-12-23 HISTORY — PX: COLONOSCOPY WITH PROPOFOL: SHX5780

## 2015-12-23 HISTORY — DX: Gastro-esophageal reflux disease without esophagitis: K21.9

## 2015-12-23 SURGERY — COLONOSCOPY WITH PROPOFOL
Anesthesia: Monitor Anesthesia Care

## 2015-12-23 MED ORDER — ESMOLOL HCL 100 MG/10ML IV SOLN
INTRAVENOUS | Status: DC | PRN
  Administered 2015-12-23 (×3): 30 mg via INTRAVENOUS

## 2015-12-23 MED ORDER — ESMOLOL HCL 100 MG/10ML IV SOLN
INTRAVENOUS | Status: AC
  Filled 2015-12-23: qty 10

## 2015-12-23 MED ORDER — LIDOCAINE HCL (CARDIAC) 20 MG/ML IV SOLN
INTRAVENOUS | Status: DC | PRN
  Administered 2015-12-23: 100 mg via INTRAVENOUS

## 2015-12-23 MED ORDER — LACTATED RINGERS IV SOLN
INTRAVENOUS | Status: DC
  Administered 2015-12-23: 1000 mL via INTRAVENOUS

## 2015-12-23 MED ORDER — PROPOFOL 10 MG/ML IV BOLUS
INTRAVENOUS | Status: AC
  Filled 2015-12-23: qty 60

## 2015-12-23 MED ORDER — ONDANSETRON HCL 4 MG/2ML IJ SOLN
INTRAMUSCULAR | Status: DC | PRN
  Administered 2015-12-23: 4 mg via INTRAVENOUS

## 2015-12-23 MED ORDER — SODIUM CHLORIDE 0.9 % IV SOLN: INTRAVENOUS | Status: DC

## 2015-12-23 MED ORDER — PROPOFOL 500 MG/50ML IV EMUL
INTRAVENOUS | Status: DC | PRN
  Administered 2015-12-23: 75 ug/kg/min via INTRAVENOUS

## 2015-12-23 MED ORDER — ONDANSETRON HCL 4 MG/2ML IJ SOLN
INTRAMUSCULAR | Status: AC
  Filled 2015-12-23: qty 2

## 2015-12-23 MED ORDER — LIDOCAINE HCL (CARDIAC) 20 MG/ML IV SOLN
INTRAVENOUS | Status: AC
  Filled 2015-12-23: qty 5

## 2015-12-23 MED ORDER — PROPOFOL 10 MG/ML IV BOLUS
INTRAVENOUS | Status: DC | PRN
  Administered 2015-12-23: 20 mg via INTRAVENOUS
  Administered 2015-12-23: 10 mg via INTRAVENOUS
  Administered 2015-12-23: 20 mg via INTRAVENOUS
  Administered 2015-12-23: 10 mg via INTRAVENOUS
  Administered 2015-12-23: 20 mg via INTRAVENOUS

## 2015-12-23 SURGICAL SUPPLY — 21 items

## 2015-12-23 NOTE — H&P (Signed)
  Problem: Chronic abdominal pain with nausea. Chronic irritable bowel syndrome. Normal barium upper GI x-ray series performed on 06/17/2012. Normal CT scan of the abdomen and pelvis performed on 04/04/2012. Abdominal ultrasound performed on 03/20/2010 showed a fatty appearing liver. Normal esophagogastroduodenoscopy with small bowel biopsies performed on 02/03/2010. Normal barium upper GI x-ray series performed on 12/15/2009. Normal screening colonoscopy performed on 06/15/2007. Remote total abdominal hysterectomy with bilateral salpingo-oophorectomy performed to treat endometriosis. 2003 diagnostic laparoscopy with lysis of adhesions performed. Normal esophagogastroduodenoscopy performed on 03/30/2001.  History: The patient is a 60 year old female born November 06, 1955. She is scheduled to undergo a repeat screening colonoscopy today. She has chronic abdominal pain associated with nausea but no vomiting, heartburn, diarrhea, or constipation. Intermittently she experiences abdominal bloating discomfort across her upper abdomen and into the left upper quadrant. Bowel movements are occasionally induce her sensation of abdominal bloating.  Past medical history: Cataract surgery. TAH-BSO performed to treat endometriosis. Chronic irritable bowel syndrome. Chronic interstitial cystitis syndrome. Hypercholesterolemia. Hypertriglyceridemia. Kidney stones.  Medication allergies: Sulfa drugs. Macrobid.  Exam: The patient is alert and lying comfortably on the endoscopy stretcher. Abdomen is soft and nontender to palpation. Lungs are clear to auscultation. Cardiac exam reveals a regular rhythm.  Plan: Proceed with repeat screening colonoscopy

## 2015-12-23 NOTE — Anesthesia Preprocedure Evaluation (Addendum)
Anesthesia Evaluation  Patient identified by MRN, date of birth, ID band Patient awake    Reviewed: Allergy & Precautions, NPO status , Patient's Chart, lab work & pertinent test results  History of Anesthesia Complications (+) PONV and history of anesthetic complications  Airway Mallampati: II  TM Distance: >3 FB Neck ROM: Full    Dental  (+) Teeth Intact   Pulmonary neg pulmonary ROS,    breath sounds clear to auscultation       Cardiovascular negative cardio ROS   Rhythm:Regular Rate:Normal     Neuro/Psych PSYCHIATRIC DISORDERS Anxiety negative neurological ROS     GI/Hepatic Neg liver ROS, GERD  ,  Endo/Other  negative endocrine ROS  Renal/GU Renal disease  negative genitourinary   Musculoskeletal negative musculoskeletal ROS (+)   Abdominal   Peds negative pediatric ROS (+)  Hematology negative hematology ROS (+)   Anesthesia Other Findings - HLD  Reproductive/Obstetrics negative OB ROS                            Anesthesia Physical Anesthesia Plan  ASA: II  Anesthesia Plan: MAC   Post-op Pain Management:    Induction: Intravenous  Airway Management Planned: Natural Airway and Simple Face Mask  Additional Equipment:   Intra-op Plan:   Post-operative Plan:   Informed Consent: I have reviewed the patients History and Physical, chart, labs and discussed the procedure including the risks, benefits and alternatives for the proposed anesthesia with the patient or authorized representative who has indicated his/her understanding and acceptance.   Dental advisory given  Plan Discussed with: CRNA  Anesthesia Plan Comments:         Anesthesia Quick Evaluation

## 2015-12-23 NOTE — Anesthesia Postprocedure Evaluation (Signed)
Anesthesia Post Note  Patient: Annette Bartlett  Procedure(s) Performed: Procedure(s) (LRB): COLONOSCOPY WITH PROPOFOL (N/A)  Patient location during evaluation: PACU Anesthesia Type: MAC Level of consciousness: awake and alert Pain management: pain level controlled Vital Signs Assessment: post-procedure vital signs reviewed and stable Respiratory status: spontaneous breathing, nonlabored ventilation, respiratory function stable and patient connected to nasal cannula oxygen Cardiovascular status: stable and blood pressure returned to baseline Anesthetic complications: no    Last Vitals:  Filed Vitals:   12/23/15 1032 12/23/15 1154  BP: 208/97 186/93  Pulse: 74 68  Temp: 36.6 C 36.4 C  Resp: 13 14    Last Pain: There were no vitals filed for this visit.               Shelton SilvasKevin D Abreanna Drawdy

## 2015-12-23 NOTE — Discharge Instructions (Signed)

## 2015-12-23 NOTE — Op Note (Signed)
[facility] Patient Name: Annette Bartlett Procedure Date: 12/23/2015 MRN: [id number] Attending MD: Charolett Bumpers , MD Date of Birth: 1955/11/17 CSN:  Age: 60 Admit Type: Outpatient Procedure:                Colonoscopy Indications:              Screening for colorectal malignant neoplasm,                            Incidental - Abdominal pain in the left upper                            quadrant Providers:                Charolett Bumpers, MD, Omelia Blackwater, RN, Arlee Muslim, Technician Referring MD:              Medicines:                Propofol per Anesthesia Complications:            No immediate complications. Estimated Blood Loss:     Estimated blood loss: none. Procedure:                Pre-Anesthesia Assessment:                           - Prior to the procedure, a History and Physical                            was performed, and patient medications and                            allergies were reviewed. The patient's tolerance of                            previous anesthesia was also reviewed. The risks                            and benefits of the procedure and the sedation                            options and risks were discussed with the patient.                            All questions were answered, and informed consent                            was obtained. Prior Anticoagulants: The patient has                            taken no previous anticoagulant or antiplatelet                            agents. ASA Grade Assessment: II -  A patient with                            mild systemic disease. After reviewing the risks                            and benefits, the patient was deemed in                            satisfactory condition to undergo the procedure.                           After obtaining informed consent, the colonoscope                            was passed under direct vision. Throughout the                      procedure, the patient's blood pressure, pulse, and                            oxygen saturations were monitored continuously. The                            Colonoscope was introduced through the anus and                            advanced to the the cecum, identified by                            appendiceal orifice and ileocecal valve. The                            colonoscopy was performed without difficulty. The                            patient tolerated the procedure well. The quality                            of the bowel preparation was good. The ileocecal                            valve, the appendiceal orifice and the rectum were                            photographed. Findings:      The perianal and digital rectal examinations were normal.      The entire examined colon appeared normal. Impression:               - The entire examined colon is normal.                           - No specimens collected. Moderate Sedation:      N/A- Per Anesthesia Care Recommendation:           - Repeat colonoscopy in  10 years for screening                            purposes.                           - Resume previous diet.                           - Continue present medications. Procedure Code(s):        --- Professional ---                           O9629, Colorectal cancer screening; colonoscopy on                            individual not meeting criteria for high risk Diagnosis Code(s):        --- Professional ---                           Z12.11, Encounter for screening for malignant                            neoplasm of colon CPT copyright 2016 American Medical Association. All rights reserved. The codes documented in this report are preliminary and upon coder review may  be revised to meet current compliance requirements. Danise Edge, MD Charolett Bumpers, MD 12/23/2015 11:52:14 AM This report has been signed electronically. Number of Addenda: 0

## 2015-12-23 NOTE — Transfer of Care (Signed)
Immediate Anesthesia Transfer of Care Note  Patient: Annette Bartlett  Procedure(s) Performed: Procedure(s): COLONOSCOPY WITH PROPOFOL (N/A)  Patient Location: ENDO  Anesthesia Type:MAC  Level of Consciousness:  sedated, patient cooperative and responds to stimulation  Airway & Oxygen Therapy:Patient Spontanous Breathing and Patient connected to face mask oxgen  Post-op Assessment:  Report given to ENDO RN and Post -op Vital signs reviewed and stable  Post vital signs:  Reviewed and stable  Last Vitals:  Filed Vitals:   12/23/15 1032  BP: 208/97  Pulse: 74  Temp: 36.6 C  Resp: 13    Complications: No apparent anesthesia complications

## 2016-02-16 ENCOUNTER — Other Ambulatory Visit: Payer: Self-pay | Admitting: Internal Medicine

## 2016-02-16 ENCOUNTER — Ambulatory Visit
Admission: RE | Admit: 2016-02-16 | Discharge: 2016-02-16 | Disposition: A | Discharge status: Home / Self Care | Payer: BLUE CROSS/BLUE SHIELD | Source: Ambulatory Visit | Attending: Internal Medicine | Admitting: Internal Medicine

## 2016-02-16 DIAGNOSIS — R1084 Generalized abdominal pain: Secondary | ICD-10-CM

## 2016-02-16 MED ORDER — IOPAMIDOL (ISOVUE-300) INJECTION 61%
100.0000 mL | Freq: Once | INTRAVENOUS | Status: AC | PRN
  Administered 2016-02-16: 100 mL via INTRAVENOUS

## 2016-04-14 ENCOUNTER — Other Ambulatory Visit: Payer: Self-pay | Admitting: Nurse Practitioner

## 2016-04-14 ENCOUNTER — Ambulatory Visit
Admission: RE | Admit: 2016-04-14 | Discharge: 2016-04-14 | Disposition: A | Discharge status: Home / Self Care | Payer: PRIVATE HEALTH INSURANCE | Source: Ambulatory Visit | Attending: Nurse Practitioner | Admitting: Nurse Practitioner

## 2016-04-14 DIAGNOSIS — S161XXA Strain of muscle, fascia and tendon at neck level, initial encounter: Secondary | ICD-10-CM

## 2016-04-15 ENCOUNTER — Encounter (HOSPITAL_COMMUNITY): Payer: Self-pay | Admitting: *Deleted

## 2016-04-15 ENCOUNTER — Emergency Department (HOSPITAL_COMMUNITY)
Admission: EM | Admit: 2016-04-15 | Discharge: 2016-04-15 | Disposition: A | Discharge status: Home / Self Care | Payer: No Typology Code available for payment source | Attending: Emergency Medicine | Admitting: Emergency Medicine

## 2016-04-15 ENCOUNTER — Emergency Department (HOSPITAL_COMMUNITY): Payer: No Typology Code available for payment source

## 2016-04-15 DIAGNOSIS — R51 Headache: Secondary | ICD-10-CM | POA: Diagnosis present

## 2016-04-15 DIAGNOSIS — Z79899 Other long term (current) drug therapy: Secondary | ICD-10-CM | POA: Insufficient documentation

## 2016-04-15 DIAGNOSIS — I10 Essential (primary) hypertension: Secondary | ICD-10-CM | POA: Diagnosis not present

## 2016-04-15 DIAGNOSIS — M7918 Myalgia, other site: Secondary | ICD-10-CM

## 2016-04-15 LAB — CBC WITH DIFFERENTIAL/PLATELET
BASOS ABS: 0 10*3/uL (ref 0.0–0.1)
Basophils Relative: 1 %
Eosinophils Absolute: 0.1 10*3/uL (ref 0.0–0.7)
Eosinophils Relative: 1 %
HCT: 41.9 % (ref 36.0–46.0)
Hemoglobin: 13.9 g/dL (ref 12.0–15.0)
LYMPHS PCT: 25 %
Lymphs Abs: 2.2 10*3/uL (ref 0.7–4.0)
MCH: 28.2 pg (ref 26.0–34.0)
MCHC: 33.2 g/dL (ref 30.0–36.0)
MCV: 85 fL (ref 78.0–100.0)
Monocytes Absolute: 0.6 10*3/uL (ref 0.1–1.0)
Monocytes Relative: 7 %
Neutro Abs: 5.9 10*3/uL (ref 1.7–7.7)
Neutrophils Relative %: 66 %
PLATELETS: 208 10*3/uL (ref 150–400)
RBC: 4.93 MIL/uL (ref 3.87–5.11)
RDW: 12.9 % (ref 11.5–15.5)
WBC: 8.8 10*3/uL (ref 4.0–10.5)

## 2016-04-15 LAB — COMPREHENSIVE METABOLIC PANEL
ALT: 115 U/L — ABNORMAL HIGH (ref 14–54)
ANION GAP: 12 (ref 5–15)
AST: 107 U/L — AB (ref 15–41)
Albumin: 4.2 g/dL (ref 3.5–5.0)
Alkaline Phosphatase: 74 U/L (ref 38–126)
BUN: 11 mg/dL (ref 6–20)
CHLORIDE: 102 mmol/L (ref 101–111)
CO2: 22 mmol/L (ref 22–32)
Calcium: 9.8 mg/dL (ref 8.9–10.3)
Creatinine, Ser: 0.64 mg/dL (ref 0.44–1.00)
Glucose, Bld: 139 mg/dL — ABNORMAL HIGH (ref 65–99)
POTASSIUM: 3.6 mmol/L (ref 3.5–5.1)
Sodium: 136 mmol/L (ref 135–145)
Total Bilirubin: 0.8 mg/dL (ref 0.3–1.2)
Total Protein: 7.1 g/dL (ref 6.5–8.1)

## 2016-04-15 LAB — I-STAT TROPONIN, ED: TROPONIN I, POC: 0 ng/mL (ref 0.00–0.08)

## 2016-04-15 MED ORDER — KETOROLAC TROMETHAMINE 30 MG/ML IJ SOLN
30.0000 mg | Freq: Once | INTRAMUSCULAR | Status: AC
  Administered 2016-04-15: 30 mg via INTRAVENOUS
  Filled 2016-04-15: qty 1

## 2016-04-15 NOTE — ED Notes (Signed)
[facility] Pt states she fell out of the bed yesterday and hit her head on her night stand. Pt states today she has been lightheaded, nauseous and has had a h/a. Pt states she has no known hx of htn.

## 2016-04-15 NOTE — Discharge Instructions (Signed)
Annette Bartlett,  Your pain seems musculoskeletal in nature. Imaging of your head today showed no acute bleeding. Imaging of your neck yesterday showed no evidence of fracture. Continue taking tramadol and flexeril for muscle pain. Heat (bath, heating pad) may also be helpful. You may take ibuprofen for headache. If headache does not respond to medication, gets worse, or you have vomiting, please be re-evaluated.   Chest x-ray did show a right pulmonary lung nodule, which you should have followed by your primary care doctor. Your liver function was also somewhat elevated, which should also be followed up with by your doctor. If your blood pressure does not come down as your headache improves, please seek medical attention sooner.

## 2016-04-15 NOTE — ED Notes (Signed)
Pt is in stable condition upon d/c and ambulates from ED. 

## 2016-04-15 NOTE — ED Notes (Signed)
Pt being transported to x-ray

## 2016-04-15 NOTE — ED Provider Notes (Signed)
CSN: [id number]     Arrival date & time 04/15/16  1001 History   First MD Initiated Contact with Patient 04/15/16 1009     Chief Complaint  Patient presents with  . Hypertension  . Fall    (Consider location/radiation/quality/duration/timing/severity/associated sxs/prior Treatment) HPI   Rosemary Hausman is a 60-y/o female who presents with continued headache, nausea and lightheadedness after fall. She says she was stretching on her bed yesterday when she fell off and hit the top of her head and L shoulder on her nightstand. She denies nausea or loss of consciousness. She was seen by her doctor yesterday and was given a muscle relaxant and tramadol. She had a negative x-ray of her cervical spine yesterday. She took half of a tramadol and a quarter of the muscle relaxant before bed with minimal relief; she did not take full doses, as she did not know how the medications would affect her at work the next day. She had a headache all last night and had nausea at work this morning.   Of note, she denies history of HTN.   Past Medical History  Diagnosis Date  . Anxiety     takes lexapro  . History of kidney stones 08/2013  . Irritable bowel syndrome     "pain left side"  . Hypercholesteremia   . Hypertriglyceridemia   . Complication of anesthesia     slow to awaken after surgery.  Marland Kitchen PONV (postoperative nausea and vomiting)   . History of kidney stones     x2 , 1 passed, 1 litho procedure  . GERD (gastroesophageal reflux disease)     occ.   Past Surgical History  Procedure Laterality Date  . Abdominal hysterectomy  1984  . Esophagogastroduodenoscopy    . Cataract extraction, bilateral Bilateral   . Colonoscopy with propofol N/A 12/23/2015    Procedure: COLONOSCOPY WITH PROPOFOL;  Surgeon: Charolett Bumpers, MD;  Location: [facility];  Service: Endoscopy;  Laterality: N/A;   Family History  Problem Relation Age of Onset  . Family history unknown: Yes   Social History  Substance Use  Topics  . Smoking status: Never Smoker   . Smokeless tobacco: Never Used  . Alcohol Use: No   OB History    No data available     Review of Systems  Eyes: Negative for visual disturbance.  Respiratory: Negative for shortness of breath.   Cardiovascular: Negative for chest pain and palpitations.  Gastrointestinal: Positive for nausea. Negative for vomiting, abdominal pain and diarrhea.  Musculoskeletal: Positive for neck pain. Negative for back pain and neck stiffness.  Skin: Negative for rash and wound.  Neurological: Positive for light-headedness and headaches. Negative for weakness.      Allergies  Macrobid and Sulfa antibiotics  Home Medications   Prior to Admission medications   Medication Sig Start Date End Date Taking? Authorizing Provider  acetaminophen (TYLENOL) 500 MG tablet Take 1,000 mg by mouth every 6 (six) hours as needed (Pain).   Yes Historical Provider, MD  escitalopram (LEXAPRO) 10 MG tablet Take 5 mg by mouth every morning.    Yes Historical Provider, MD  estrogens, conjugated, (PREMARIN) 0.9 MG tablet Take 0.9 mg by mouth daily.   Yes Historical Provider, MD  omeprazole (PRILOSEC) 40 MG capsule Take 40 mg by mouth daily as needed (Heartburn).   Yes Historical Provider, MD  traZODone (DESYREL) 50 MG tablet Take 25 mg by mouth at bedtime.    Yes Historical Provider, MD   BP  161/93 mmHg  Pulse 71  Temp(Src) 97.5 F (36.4 C) (Oral)  Resp 22  SpO2 97% Physical Exam  Constitutional: She is oriented to person, place, and time. She appears well-developed and well-nourished. No distress.  HENT:  Head: Normocephalic and atraumatic.  Mouth/Throat: Oropharynx is clear and moist.  Eyes: Conjunctivae and EOM are normal. Pupils are equal, round, and reactive to light.  Neck: Normal range of motion. Neck supple.  Cardiovascular: Normal rate, regular rhythm, normal heart sounds and intact distal pulses.   No murmur heard. Pulmonary/Chest: Effort normal and breath  sounds normal. No respiratory distress. She has no wheezes. She exhibits no tenderness.  Abdominal: Soft. Bowel sounds are normal. She exhibits no distension. There is no tenderness. There is no rebound and no guarding.  Musculoskeletal: Normal range of motion. She exhibits no edema or tenderness.  Discomfort in L side of neck with active lateral flexion and rotation. Negative Spurling's test. Negative Empty Can test bilaterally. No TTP at [facility] joints bilaterally. No TTP over midline spine or lateral back and neck.   Lymphadenopathy:    She has no cervical adenopathy.  Neurological: She is alert and oriented to person, place, and time. No cranial nerve deficit. Coordination normal.  Strength and sensation of UEs intact.  Skin: Skin is warm and dry. No rash noted.  No lesion or bruising of top of patient's head.   Psychiatric: She has a normal mood and affect. Her behavior is normal.  Nursing note and vitals reviewed.   ED Course  Procedures (including critical care time) Labs Review Labs Reviewed  COMPREHENSIVE METABOLIC PANEL - Abnormal; Notable for the following:    Glucose, Bld 139 (*)    AST 107 (*)    ALT 115 (*)    All other components within normal limits  CBC WITH DIFFERENTIAL/PLATELET  Rosezena Sensor, ED    Imaging Review Dg Chest 2 View  04/15/2016  CLINICAL DATA:  Larey Seat out of bed yesterday with left shoulder pain since that time. Initial encounter. EXAM: CHEST  2 VIEW COMPARISON:  10/22/2015. FINDINGS: Pleural-parenchymal scarring in the right upper lung is stable. There is a new 6 mm nodule projecting over the anterior right second rib. Atelectasis noted left lung base. No pneumothorax. No pleural effusion or pulmonary edema. Cardiopericardial silhouette is at upper limits of normal for size. The visualized bony structures of the thorax are intact. IMPRESSION: New right upper lobe pulmonary nodule. CT chest without contrast recommended to further evaluate. Electronically  Signed   By: Kennith Center M.D.   On: 04/15/2016 10:43   Dg Cervical Spine 2 Or 3 Views  04/14/2016  CLINICAL DATA:  Larey Seat out of bed today.  Cervical strain EXAM: CERVICAL SPINE - 2-3 VIEW COMPARISON:  None. FINDINGS: Normal alignment. Negative for fracture. Mild anterior spurring C4-5 and C5-6. Disc spaces maintained. Mild atherosclerotic disease in the carotid artery bilaterally. IMPRESSION: Negative for fracture. Electronically Signed   By: Marlan Palau M.D.   On: 04/14/2016 09:06   Ct Head Wo Contrast  04/15/2016  CLINICAL DATA:  Posttraumatic headache and dizziness after fall at home yesterday. No reported loss of consciousness. EXAM: CT HEAD WITHOUT CONTRAST TECHNIQUE: Contiguous axial images were obtained from the base of the skull through the vertex without intravenous contrast. COMPARISON:  CT scan of October 20, 2015. FINDINGS: Bony calvarium appears intact. Probable old infarction is seen involving the left periventricular white matter. No mass effect or midline shift is noted. Ventricular size is within normal  limits. There is no evidence of mass lesion, hemorrhage or acute infarction. IMPRESSION: No acute intracranial abnormality seen. Electronically Signed   By: Lupita RaiderJames  Green Jr, M.D.   On: 04/15/2016 10:32   I have personally reviewed and evaluated these images and lab results as part of my medical decision-making.   EKG Interpretation   Date/Time:  Thursday April 15 2016 10:07:45 EDT Ventricular Rate:  65 PR Interval:    QRS Duration: 88 QT Interval:  405 QTC Calculation: 422 R Axis:   -39 Text Interpretation:  Sinus rhythm Probable left atrial enlargement  Inferior infarct, old Anterior infarct, old Confirmed by Fayrene FearingJAMES  MD, MARK  [phone] on 04/15/2016 4:25:02 PM      MDM   Final diagnoses:  Musculoskeletal pain   Pt presents with continued headache and new nausea and lightheadedness after fall yesterday. Patient appears non-toxic on exam. Has discomfort of left neck only  with lateral rotation and flexion. CT head showed no acute intracranial abnormality, though possible old infarction noted of left periventricular white matter. X-ray cervical spine performed yesterday negative for fracture. Suspect pain is musculoskeletal in nature. Recommended continued use of prn tramadol and flexeril that were prescribed yesterday, as well as heat.   CXR performed as part of work-up for atypical chest pain and showed a 6 mm nodule of right upper lobe. CT chest w/o contrast recommended for follow-up. EKG without acute ischemic changes and troponin negative.   In addition, AST and ALT elevated on CMP. Patient does not drink and denies abdominal pain. Continue to monitor. Glucose elevated to 139. Would check hgb A1c at follow-up.   Blood pressure elevated in setting of pain. 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 Toradol shot given prior to discharge.   Dani GobbleHillary Jabree Rebert, MD [facility], PGY-2  [facility] Moen Dori Devino, MD 04/15/16 [id number]  Rolland PorterMark James, MD 04/24/16 [phone]

## 2016-04-15 NOTE — ED Notes (Signed)
Patient transported to CT 

## 2016-04-19 ENCOUNTER — Other Ambulatory Visit: Payer: Self-pay | Admitting: Internal Medicine

## 2016-04-19 DIAGNOSIS — R911 Solitary pulmonary nodule: Secondary | ICD-10-CM

## 2016-04-19 DIAGNOSIS — R930 Abnormal findings on diagnostic imaging of skull and head, not elsewhere classified: Secondary | ICD-10-CM

## 2016-04-22 ENCOUNTER — Other Ambulatory Visit: Payer: Self-pay | Admitting: Internal Medicine

## 2016-04-22 DIAGNOSIS — R945 Abnormal results of liver function studies: Principal | ICD-10-CM

## 2016-04-22 DIAGNOSIS — R7989 Other specified abnormal findings of blood chemistry: Secondary | ICD-10-CM

## 2016-04-23 ENCOUNTER — Ambulatory Visit
Admission: RE | Admit: 2016-04-23 | Discharge: 2016-04-23 | Disposition: A | Discharge status: Home / Self Care | Payer: PRIVATE HEALTH INSURANCE | Source: Ambulatory Visit | Attending: Internal Medicine | Admitting: Internal Medicine

## 2016-04-23 DIAGNOSIS — R911 Solitary pulmonary nodule: Secondary | ICD-10-CM

## 2016-04-23 DIAGNOSIS — R930 Abnormal findings on diagnostic imaging of skull and head, not elsewhere classified: Secondary | ICD-10-CM

## 2016-04-30 ENCOUNTER — Ambulatory Visit
Admission: RE | Admit: 2016-04-30 | Discharge: 2016-04-30 | Disposition: A | Discharge status: Home / Self Care | Payer: PRIVATE HEALTH INSURANCE | Source: Ambulatory Visit | Attending: Internal Medicine | Admitting: Internal Medicine

## 2016-04-30 DIAGNOSIS — R945 Abnormal results of liver function studies: Principal | ICD-10-CM

## 2016-04-30 DIAGNOSIS — R7989 Other specified abnormal findings of blood chemistry: Secondary | ICD-10-CM

## 2016-05-13 ENCOUNTER — Other Ambulatory Visit: Payer: Self-pay | Admitting: Gastroenterology

## 2016-05-13 DIAGNOSIS — R740 Nonspecific elevation of levels of transaminase and lactic acid dehydrogenase [LDH]: Principal | ICD-10-CM

## 2016-05-13 DIAGNOSIS — R7401 Elevation of levels of liver transaminase levels: Secondary | ICD-10-CM

## 2016-05-18 ENCOUNTER — Ambulatory Visit
Admission: RE | Admit: 2016-05-18 | Discharge: 2016-05-18 | Disposition: A | Discharge status: Home / Self Care | Payer: PRIVATE HEALTH INSURANCE | Source: Ambulatory Visit | Attending: Gastroenterology | Admitting: Gastroenterology

## 2016-05-18 DIAGNOSIS — R740 Nonspecific elevation of levels of transaminase and lactic acid dehydrogenase [LDH]: Principal | ICD-10-CM

## 2016-05-18 DIAGNOSIS — R7401 Elevation of levels of liver transaminase levels: Secondary | ICD-10-CM

## 2016-05-18 MED ORDER — GADOBENATE DIMEGLUMINE 529 MG/ML IV SOLN
11.0000 mL | Freq: Once | INTRAVENOUS | Status: AC | PRN
  Administered 2016-05-18: 11 mL via INTRAVENOUS

## 2016-10-13 ENCOUNTER — Other Ambulatory Visit: Payer: Self-pay | Admitting: Internal Medicine

## 2016-10-13 DIAGNOSIS — Z1231 Encounter for screening mammogram for malignant neoplasm of breast: Secondary | ICD-10-CM

## 2016-11-08 ENCOUNTER — Ambulatory Visit
Admission: RE | Admit: 2016-11-08 | Discharge: 2016-11-08 | Disposition: A | Discharge status: Home / Self Care | Payer: No Typology Code available for payment source | Source: Ambulatory Visit | Attending: Internal Medicine | Admitting: Internal Medicine

## 2016-11-08 DIAGNOSIS — Z1231 Encounter for screening mammogram for malignant neoplasm of breast: Secondary | ICD-10-CM

## 2016-12-01 ENCOUNTER — Encounter: Payer: No Typology Code available for payment source | Attending: Internal Medicine | Admitting: Registered"

## 2016-12-01 DIAGNOSIS — R7309 Other abnormal glucose: Secondary | ICD-10-CM

## 2016-12-01 DIAGNOSIS — Z713 Dietary counseling and surveillance: Secondary | ICD-10-CM | POA: Diagnosis not present

## 2016-12-01 DIAGNOSIS — R7303 Prediabetes: Secondary | ICD-10-CM | POA: Insufficient documentation

## 2016-12-01 NOTE — Progress Notes (Signed)
Medical Nutrition Therapy:  Appt start time: 1110 end time:  1205 .   Assessment:  Primary concerns today: Patient reports that her doctor told her that she needs to eat better and exercise more. Last A1c is 6.6. She tried to stop eating bread and other carbs, but she didn't feel good and went back to her previous eating habits.   Preferred Learning Style:   No preference indicated   Learning Readiness:   Ready  MEDICATIONS: reviewed   DIETARY INTAKE:  Usual eating pattern includes 3 meals and 1-2 snacks per day.  Likes "plain" food.      24-hr recall:  B ( AM): cereal   Snk ( AM): fruit OR Triscuits OR occasionally sweets  L ( PM): sandwich OR leftovers OR lunch provided by vendors Snk ( PM): none D ( PM): spaghetti, bread OR hotdogs or hamburgers OR cereal OR salad OR Sundays fixes big family meal with meat, potatoes, green beans, dessert Snk ( PM): none OR ice cream occasionally Beverages: cranberry grape juice, water, dr pepper   Usual physical activity: summer time will walk outside. Walks a lot at work.  Estimated energy needs: 1600 calories 180 g carbohydrates 120 g protein 44 g fat  Progress Towards Goal(s):  In progress.   Nutritional Diagnosis:  NI-5.8.2 Excessive carbohydrate intake As related to carbohydrate snacks and drinks.  As evidenced by diet recall including juice and soda and elevated A1c of 6.6..    Intervention:  Nutrition education for managing blood glucose with diet and lifestyle changes. Described the role of different macronutrients on glucose.  Explained how carbohydrates affect blood glucose.  Stated what foods contain the most carbohydrates.  Demonstrated carbohydrate counting.  Demonstrated how to read Nutrition Facts food label.  Plan:  Aim for 2-3 Carb Choices per meal (30-45 grams) +/- 1 either way  Aim for 0-1 Carbs per snack if hungry  Include more whole grains - bread, pasta, rice, etc. Aim for 1/2 plate of non-starchy  vegetables Include protein in moderation with your meals and snacks Consider reading food labels for Total Carbohydrate Consider increasing your activity level by 30 min daily as tolerated Limit sugary drinks  Teaching Method Utilized:  Visual Auditory Hands on  Handouts given during visit include:  My Plate  Carb/meal planning card  Barriers to learning/adherence to lifestyle change: none  Demonstrated degree of understanding via:  Teach Back   Monitoring/Evaluation:  Dietary intake, exercise, A1c, follow-up prn.

## 2016-12-01 NOTE — Patient Instructions (Signed)
Plan:  Aim for 2-3 Carb Choices per meal (30-45 grams) +/- 1 either way  Aim for 0-1 Carbs per snack if hungry  Include more whole grains - bread, pasta, rice, etc. Aim for 1/2 plate of non-starchy vegetables Include protein in moderation with your meals and snacks Consider reading food labels for Total Carbohydrate Consider increasing your activity level by 30 min daily as tolerated Limit sugary drinks

## 2017-06-28 ENCOUNTER — Other Ambulatory Visit: Payer: Self-pay | Admitting: Internal Medicine

## 2017-06-28 DIAGNOSIS — I6523 Occlusion and stenosis of bilateral carotid arteries: Secondary | ICD-10-CM

## 2017-06-29 ENCOUNTER — Encounter: Payer: Self-pay | Admitting: Neurology

## 2017-06-30 ENCOUNTER — Ambulatory Visit
Admission: RE | Admit: 2017-06-30 | Discharge: 2017-06-30 | Disposition: A | Discharge status: Home / Self Care | Payer: No Typology Code available for payment source | Source: Ambulatory Visit | Attending: Internal Medicine | Admitting: Internal Medicine

## 2017-06-30 DIAGNOSIS — I6523 Occlusion and stenosis of bilateral carotid arteries: Secondary | ICD-10-CM

## 2017-08-02 ENCOUNTER — Ambulatory Visit (INDEPENDENT_AMBULATORY_CARE_PROVIDER_SITE_OTHER): Payer: No Typology Code available for payment source | Admitting: Interventional Cardiology

## 2017-08-02 ENCOUNTER — Encounter: Payer: Self-pay | Admitting: Interventional Cardiology

## 2017-08-02 VITALS — BP 166/90 | HR 84 | Ht 64.0 in | Wt 137.4 lb

## 2017-08-02 DIAGNOSIS — R0789 Other chest pain: Secondary | ICD-10-CM

## 2017-08-02 DIAGNOSIS — E7800 Pure hypercholesterolemia, unspecified: Secondary | ICD-10-CM

## 2017-08-02 DIAGNOSIS — I6523 Occlusion and stenosis of bilateral carotid arteries: Secondary | ICD-10-CM

## 2017-08-02 MED ORDER — EZETIMIBE 10 MG PO TABS: 10.0000 mg | ORAL_TABLET | Freq: Every day | ORAL | 3 refills | Status: DC

## 2017-08-02 NOTE — Patient Instructions (Signed)
Medication Instructions:  Your physician has recommended you make the following change in your medication:   START: zetia 10 mg daily  Labwork: None ordered  Testing/Procedures: Your physician has requested that you have a stress echocardiogram. For further information please visit [email or web address] Please follow instruction sheet as given.   Follow-Up: Based on test results   Any Other Special Instructions Will Be Listed Below (If Applicable).   Exercise Stress Echocardiogram An exercise stress echocardiogram is a test that checks how well your heart is working. For this test, you will walk on a treadmill to make your heart beat faster. This test uses sound waves (ultrasound) and a computer to make pictures (images) of your heart. These pictures will be taken before you exercise and after you exercise. What happens before the procedure?  Follow instructions from your doctor about what you cannot eat or drink before the test.  Do not drink or eat anything that has caffeine in it. Stop having caffeine for 24 hours before the test.  Ask your doctor about changing or stopping your normal medicines. This is important if you take diabetes medicines or blood thinners. Ask your doctor if you should take your medicines with water before the test.  If you use an inhaler, bring it to the test.  Do not use any products that have nicotine or tobacco in them, such as cigarettes and e-cigarettes. Stop using them for 4 hours before the test. If you need help quitting, ask your doctor.  Wear comfortable shoes and clothing. What happens during the procedure?  You will be hooked up to a TV screen. Your doctor will watch the screen to see how fast your heart beats during the test.  Before you exercise, a computer will make a picture of your heart. To do this: ? A gel will be put on your chest. ? A wand will be moved over the gel. ? Sound waves from the wand will go to the computer to make  the picture.  Your will start walking on a treadmill. The treadmill will start at a slow speed. It will get faster a little bit at a time. When you walk faster, your heart will beat faster.  The treadmill will be stopped when your heart is working hard.  You will lie down right away so another picture of your heart can be taken.  The test will take 30-60 minutes. What happens after the procedure?  Your heart rate and blood pressure will be watched after the test.  If your doctor says that you can, you may: ? Eat what you usually eat. ? Do your normal activities. ? Take medicines like normal. Summary  An exercise stress echocardiogram is a test that checks how well your heart is working.  Follow instructions about what you cannot eat or drink before the test. Ask your doctor if you should take your normal medicines before the test.  Stop having caffeine for 24 hours before the test. Do not use anything with nicotine or tobacco in it for 4 hours before the test.  A computer will take a picture of your heart before you walk on a treadmill. It will take another picture when you are done walking.  Your heart rate and blood pressure will be watched after the test. This information is not intended to replace advice given to you by your health care provider. Make sure you discuss any questions you have with your health care provider. Document Released: 07/18/2009 Document Revised: 06/13/2016  Document Reviewed: 06/13/2016 Elsevier Interactive Patient Education  2017 [employer]    If you need a refill on your cardiac medications before your next appointment, please call your pharmacy.

## 2017-08-02 NOTE — Progress Notes (Signed)
Cardiology Office Note   Date:  08/02/2017   ID:  Annette Bartlett, DOB May 21, 1956, MRN [id number]  PCP:  Kendrick Ranch, MD    No chief complaint on file. chest pain   Wt Readings from Last 3 Encounters:  08/02/17 137 lb 6.4 oz (62.3 kg)  12/23/15 127 lb (57.6 kg)  08/20/13 127 lb (57.6 kg)       History of Present Illness: Mashawn Brazil Lisa is a 61 y.o. female who is being seen today for the evaluation of chest pain at the request of Schoenhoff, Harrington Challenger, *.    She has had carotid artery disease, HTN.  She had a recent carotid study showing plaque and slightly decreased BP in the right arm.  She has had chest pressure on the left side of her chest.  She gets tired more easily.  She does work in her yard and she feels that she is more tired.  Her husband passed away from lung cancer in 2012.  She gets some help from her grandson.    She has been statin intolerant in the past.  She thinks she may have tried Zetia.  She does not want to try another statin.    Concern for right arm lower BP.  No sx.  Please see below for further details.    Past Medical History:  Diagnosis Date  . Anxiety    takes lexapro  . Carotid stenosis   . Chronic interstitial cystitis   . Complication of anesthesia    slow to awaken after surgery.  . Elevated glucose   . Elevated transaminase level   . Essential hypertension   . GERD (gastroesophageal reflux disease)    occ.  . H/O endometritis   . Hepatic steatosis    hepatomegaly  . Hepatomegaly   . History of kidney stones 08/2013   x2 , 1 passed, 1 litho procedure  . Hypercholesteremia   . Hypertriglyceridemia   . Irritable bowel syndrome    "pain left side"  . Nephrolithiasis   . Osteopenia of right hip   . PONV (postoperative nausea and vomiting)   . Renal calculus 2013  . Vitamin D deficiency     Past Surgical History:  Procedure Laterality Date  . ABDOMINAL HYSTERECTOMY  1984  . CATARACT EXTRACTION, BILATERAL  Bilateral   . COLONOSCOPY WITH PROPOFOL N/A 12/23/2015   Procedure: COLONOSCOPY WITH PROPOFOL;  Surgeon: Charolett Bumpers, MD;  Location: [facility];  Service: Endoscopy;  Laterality: N/A;  . ESOPHAGOGASTRODUODENOSCOPY       Current Outpatient Prescriptions  Medication Sig Dispense Refill  . acetaminophen (TYLENOL) 500 MG tablet Take 1,000 mg by mouth every 6 (six) hours as needed (Pain).    Marland Kitchen ALPRAZolam (XANAX) 0.5 MG tablet Take 0.5 mg by mouth as needed.    Marland Kitchen aspirin 81 MG chewable tablet Chew by mouth daily.    . cholecalciferol (VITAMIN D) 1000 units tablet Take 1,000 Units by mouth daily.    Marland Kitchen dicyclomine (BENTYL) 10 MG capsule Take 20 mg by mouth 4 (four) times daily as needed for spasms.    Marland Kitchen escitalopram (LEXAPRO) 10 MG tablet Take 5 mg by mouth every morning.     . estrogens, conjugated, (PREMARIN) 0.9 MG tablet Take 0.9 mg by mouth daily.    . hyoscyamine (LEVBID) 0.375 MG 12 hr tablet Take 0.375 mg by mouth 2 (two) times daily.    Marland Kitchen losartan (COZAAR) 50 MG tablet Take 100 mg by  mouth daily.    . ranitidine (ZANTAC) 150 MG tablet Take 150 mg by mouth as needed for heartburn.    . traMADol (ULTRAM) 50 MG tablet Take 50 mg by mouth every 6 (six) hours as needed.    . traZODone (DESYREL) 50 MG tablet Take 25 mg by mouth at bedtime.     . vitamin E 400 UNIT capsule Take 800 Units by mouth daily.     No current facility-administered medications for this visit.     Allergies:   Macrobid [nitrofurantoin] and Sulfa antibiotics    Social History:  The patient  reports that she has never smoked. She has never used smokeless tobacco. She reports that she does not drink alcohol or use drugs.   Family History:  The patient's Family history is unknown by patient.    ROS:  Please see the history of present illness.   Otherwise, review of systems are positive for chest discomfort.   All other systems are reviewed and negative.    PHYSICAL EXAM: VS:  BP (!) 166/90 (BP Location: Right  Arm, Patient Position: Sitting, Cuff Size: Normal)   Pulse 84   Ht 5\' 4"  (1.626 m)   Wt 137 lb 6.4 oz (62.3 kg)   SpO2 98%   BMI 23.58 kg/m  , BMI Body mass index is 23.58 kg/m. GEN: Well nourished, well developed, in no acute distress  HEENT: normal  Neck: no JVD, carotid bruits, or masses Cardiac: RRR; no murmurs, rubs, or gallops,no edema  Respiratory:  clear to auscultation bilaterally, normal work of breathing GI: soft, nontender, nondistended, + BS MS: no deformity or atrophy  Skin: warm and dry, no rash Neuro:  Strength and sensation are intact Psych: euthymic mood, full affect   EKG:   The ekg ordered today demonstrates NSR, poor R wave progression, nonspecific ST changes   Recent Labs: No results found for requested labs within last 8760 hours.   Lipid Panel No results found for: CHOL, TRIG, HDL, CHOLHDL, VLDL, LDLCALC, LDLDIRECT   Other studies Reviewed: Additional studies/ records that were reviewed today with results demonstrating: 2012 nuclear stress test negative for ischemia.   ASSESSMENT AND PLAN:  1. Chest discomfort: Some atypical features.  She is not very physically active and has some shortness of breath when walking up a hill.  We will plan for stress echo to evaluate for ischemia. 2. Hyperlipidemia: Start Zetia 10 mg daily.  Lipids will need to be rechecked in a few months.  LDL 171 in 9/18. 3. Carotid artery disease: Mild disease bilaterally.  There is a question of decreased blood pressure in the right arm.  Vertebral artery blood flow on the right is antegrade which argues against any significant subclavian stenosis.  She has strong radial pulses bilaterally.  Today, blood pressure did not show any significant difference between the right and left arm.  She has no symptoms of arm claudication.  At this point, I would not pursue any further workup. 4. HTN: WOuld consider adding amlodipine 5 mg daily for BP lowering therapy if BP remains high.      Current medicines are reviewed at length with the patient today.  The patient concerns regarding her medicines were addressed.  The following changes have been made:  No change  Labs/ tests ordered today include:  No orders of the defined types were placed in this encounter.   Recommend 150 minutes/week of aerobic exercise Low fat, low carb, high fiber diet recommended  Disposition:  FU for stress test   Signed, Lance Muss, MD  08/02/2017 9:28 AM    [facility] [address] [place], [place], [place]  [address] Phone: [phone]; Fax: [phone]

## 2017-08-18 ENCOUNTER — Telehealth (HOSPITAL_COMMUNITY): Payer: Self-pay | Admitting: *Deleted

## 2017-08-18 NOTE — Telephone Encounter (Signed)
Patient given detailed instructions per Stress Test Requisition Sheet for test on 08/24/17 at 7:30.Patient Notified to arrive 30 minutes early, and that it is imperative to arrive on time for appointment to keep from having the test rescheduled.  Patient verbalized understanding. Annette DolinSharon S Brooks

## 2017-08-24 ENCOUNTER — Other Ambulatory Visit (HOSPITAL_COMMUNITY): Payer: No Typology Code available for payment source

## 2017-08-24 ENCOUNTER — Ambulatory Visit (HOSPITAL_BASED_OUTPATIENT_CLINIC_OR_DEPARTMENT_OTHER): Payer: No Typology Code available for payment source

## 2017-08-24 ENCOUNTER — Ambulatory Visit (HOSPITAL_COMMUNITY): Payer: No Typology Code available for payment source | Attending: Internal Medicine

## 2017-08-24 DIAGNOSIS — R0789 Other chest pain: Secondary | ICD-10-CM | POA: Insufficient documentation

## 2017-09-01 ENCOUNTER — Telehealth: Payer: Self-pay | Admitting: Interventional Cardiology

## 2017-09-01 NOTE — Telephone Encounter (Signed)
New message  Pt verbalized that she is calling for the rn   For results

## 2017-09-01 NOTE — Telephone Encounter (Signed)
Called patient and made her aware of echocardiogram stress test results. Patient verbalizes understanding.  Results routed to Dr. Schoenhoff per patient's request. 

## 2017-09-01 NOTE — Telephone Encounter (Deleted)
Called patient and made her aware of echocardiogram stress test results. Patient verbalizes understanding.  Results routed to Dr. Constance GoltzSchoenhoff per patient's request.

## 2017-09-01 NOTE — Telephone Encounter (Signed)
-----   Message from Corky CraftsJayadeep S Varanasi, MD sent at 08/28/2017  5:18 PM EST ----- Normal stress test

## 2017-09-20 ENCOUNTER — Ambulatory Visit: Payer: No Typology Code available for payment source | Admitting: Neurology

## 2017-10-05 ENCOUNTER — Other Ambulatory Visit: Payer: Self-pay | Admitting: Internal Medicine

## 2017-10-05 DIAGNOSIS — Z1231 Encounter for screening mammogram for malignant neoplasm of breast: Secondary | ICD-10-CM

## 2017-11-15 ENCOUNTER — Encounter: Payer: Self-pay | Admitting: Neurology

## 2017-11-15 ENCOUNTER — Ambulatory Visit (INDEPENDENT_AMBULATORY_CARE_PROVIDER_SITE_OTHER): Payer: No Typology Code available for payment source | Admitting: Neurology

## 2017-11-15 ENCOUNTER — Other Ambulatory Visit: Payer: Self-pay

## 2017-11-15 VITALS — BP 142/78 | HR 82 | Ht 64.0 in | Wt 137.0 lb

## 2017-11-15 DIAGNOSIS — E785 Hyperlipidemia, unspecified: Secondary | ICD-10-CM

## 2017-11-15 DIAGNOSIS — I6789 Other cerebrovascular disease: Secondary | ICD-10-CM

## 2017-11-15 DIAGNOSIS — I10 Essential (primary) hypertension: Secondary | ICD-10-CM | POA: Diagnosis not present

## 2017-11-15 DIAGNOSIS — I679 Cerebrovascular disease, unspecified: Secondary | ICD-10-CM | POA: Diagnosis not present

## 2017-11-15 NOTE — Patient Instructions (Signed)
Great meeting you! Continue control of blood pressure, cholesterol, monitoring glucose levels. Goal upper number of BP is between 120 to 130. Goal LDL is less than 100. For any sudden change in symptoms, go to ER immediately. Follow-up as needed, call for any changes.

## 2017-11-15 NOTE — Progress Notes (Signed)
NEUROLOGY CONSULTATION NOTE  [name] MRN: [id number] DOB: 1956/06/16  Referring provider: Dr. Raechel Chute Primary care provider: Dr. Raechel Chute  Reason for consult:  Abnormal head CT   Dear Dr Constance Goltz:  Thank you for your kind referral of Annette Bartlett for consultation of the above symptoms. Although her history is well known to you, please allow me to reiterate it for the purpose of our medical record. Records and images were personally reviewed where available.  HISTORY OF PRESENT ILLNESS: This is a pleasant 62 year old right-handed woman with a history of hypertension, hyperlipidemia, nephrolithiasis, presenting for evaluation of an abnormal head CT done in 2017. There was a question of prior infarct. She had the CT head after she fell off the bed. She denies any focal numbness/tingling/weakness, speech difficulties, confusion. She has had a headache around her left ear for the past week and thinks she has an ear infection, otherwise she does not have frequent headaches. She feels a little dizzy with the headaches, otherwise does not regularly have dizziness. She denies any diplopia, dysarthria/dysphagia, neck/back pain, bowel/bladder dysfunction. She had previously been poorly compliant with medications, but states that since taking her medications more regularly, her BP has been better. She has a BP monitor at home, last time she checked it was 137/70. It is 142/78 in the office today. She is on Losartan and Zetia. She states she will be having her lipid panel done again soon. Bloodwork unavailable for review. She has a history of carotid stenosis, most recent carotid dopplers done 06/2017 showed minimal heterogeneous and calcified plaque with no hemodynamically significant stenosis. There was note of a differential blood pressure on the upper extremity, left greater than right, which may indicate developing subclavian stenosis. She follows with Cardiology. I  personally reviewed head CT without contrast done 04/2016 which showed a hypodensity in the left periventricular white matter, no acute changes.    PAST MEDICAL HISTORY: Past Medical History:  Diagnosis Date  . Anxiety    takes lexapro  . Carotid stenosis   . Chronic interstitial cystitis   . Complication of anesthesia    slow to awaken after surgery.  . Elevated glucose   . Elevated transaminase level   . Essential hypertension   . GERD (gastroesophageal reflux disease)    occ.  . H/O endometritis   . Hepatic steatosis    hepatomegaly  . Hepatomegaly   . History of kidney stones 08/2013   x2 , 1 passed, 1 litho procedure  . Hypercholesteremia   . Hypertriglyceridemia   . Irritable bowel syndrome    "pain left side"  . Nephrolithiasis   . Osteopenia of right hip   . PONV (postoperative nausea and vomiting)   . Renal calculus 2013  . Vitamin D deficiency     PAST SURGICAL HISTORY: Past Surgical History:  Procedure Laterality Date  . ABDOMINAL HYSTERECTOMY  1984  . CATARACT EXTRACTION, BILATERAL Bilateral   . COLONOSCOPY WITH PROPOFOL N/A 12/23/2015   Procedure: COLONOSCOPY WITH PROPOFOL;  Surgeon: Charolett Bumpers, MD;  Location: [facility];  Service: Endoscopy;  Laterality: N/A;  . ESOPHAGOGASTRODUODENOSCOPY      MEDICATIONS: Current Outpatient Medications on File Prior to Visit  Medication Sig Dispense Refill  . acetaminophen (TYLENOL) 500 MG tablet Take 1,000 mg by mouth every 6 (six) hours as needed (Pain).    Marland Kitchen ALPRAZolam (XANAX) 0.5 MG tablet Take 0.5 mg by mouth as needed.    Marland Kitchen aspirin 81 MG  chewable tablet Chew by mouth daily.    . cholecalciferol (VITAMIN D) 1000 units tablet Take 1,000 Units by mouth daily.    Marland Kitchen dicyclomine (BENTYL) 10 MG capsule Take 20 mg by mouth 4 (four) times daily as needed for spasms.    Marland Kitchen escitalopram (LEXAPRO) 10 MG tablet Take 5 mg by mouth every morning.     . estrogens, conjugated, (PREMARIN) 0.9 MG tablet Take 0.9 mg by mouth  daily.    Marland Kitchen ezetimibe (ZETIA) 10 MG tablet Take 1 tablet (10 mg total) by mouth daily. 90 tablet 3  . hyoscyamine (LEVBID) 0.375 MG 12 hr tablet Take 0.375 mg by mouth 2 (two) times daily.    Marland Kitchen losartan (COZAAR) 50 MG tablet Take 100 mg by mouth daily.    . pantoprazole (PROTONIX) 40 MG tablet     . ranitidine (ZANTAC) 150 MG tablet Take 150 mg by mouth as needed for heartburn.    . traMADol (ULTRAM) 50 MG tablet Take 50 mg by mouth every 6 (six) hours as needed.    . traZODone (DESYREL) 50 MG tablet Take 25 mg by mouth at bedtime.     . vitamin E 400 UNIT capsule Take 800 Units by mouth daily.     No current facility-administered medications on file prior to visit.     ALLERGIES: Allergies  Allergen Reactions  . Macrobid [Nitrofurantoin] Nausea And Vomiting  . Sulfa Antibiotics     Upset stomach     FAMILY HISTORY: Family History  Family history unknown: Yes    SOCIAL HISTORY: Social History   Socioeconomic History  . Marital status: Widowed    Spouse name: Not on file  . Number of children: Not on file  . Years of education: Not on file  . Highest education level: Not on file  Social Needs  . Financial resource strain: Not on file  . Food insecurity - worry: Not on file  . Food insecurity - inability: Not on file  . Transportation needs - medical: Not on file  . Transportation needs - non-medical: Not on file  Occupational History  . Not on file  Tobacco Use  . Smoking status: Never Smoker  . Smokeless tobacco: Never Used  Substance and Sexual Activity  . Alcohol use: No  . Drug use: No  . Sexual activity: Not on file    Comment: widowed 2012  Other Topics Concern  . Not on file  Social History Narrative  . Not on file    REVIEW OF SYSTEMS: Constitutional: No fevers, chills, or sweats, no generalized fatigue, change in appetite Eyes: No visual changes, double vision, eye pain Ear, nose and throat: No hearing loss, ear pain, nasal congestion, sore  throat Cardiovascular: No chest pain, palpitations Respiratory:  No shortness of breath at rest or with exertion, wheezes GastrointestinaI: No nausea, vomiting, diarrhea, abdominal pain, fecal incontinence Genitourinary:  No dysuria, urinary retention or frequency Musculoskeletal:  No neck pain, back pain Integumentary: No rash, pruritus, skin lesions Neurological: as above Psychiatric: No depression, insomnia, anxiety Endocrine: No palpitations, fatigue, diaphoresis, mood swings, change in appetite, change in weight, increased thirst Hematologic/Lymphatic:  No anemia, purpura, petechiae. Allergic/Immunologic: no itchy/runny eyes, nasal congestion, recent allergic reactions, rashes  PHYSICAL EXAM: Vitals:   11/15/17 0903  BP: (!) 142/78  Pulse: 82  SpO2: 92%   General: No acute distress Head:  Normocephalic/atraumatic Eyes: Fundoscopic exam shows bilateral sharp discs, no vessel changes, exudates, or hemorrhages Neck: supple, no paraspinal tenderness, full  range of motion Back: No paraspinal tenderness Heart: regular rate and rhythm Lungs: Clear to auscultation bilaterally. Vascular: No carotid bruits. Skin/Extremities: No rash, no edema Neurological Exam: Mental status: alert and oriented to person, place, and time, no dysarthria or aphasia, Fund of knowledge is appropriate.  Recent and remote memory are intact. 3/3 delayed recall.  Attention and concentration are normal.    Able to name objects and repeat phrases. Cranial nerves: CN I: not tested CN II: pupils equal, round and reactive to light, visual fields intact, fundi unremarkable. CN III, IV, VI:  full range of motion, no nystagmus, no ptosis CN V: facial sensation intact CN VII: upper and lower face symmetric CN VIII: hearing intact to finger rub CN IX, X: gag intact, uvula midline CN XI: sternocleidomastoid and trapezius muscles intact CN XII: tongue midline Bulk & Tone: normal, no fasciculations. Motor: 5/5  throughout with no pronator drift. Sensation: intact to light touch, cold, pin, vibration and joint position sense.  No extinction to double simultaneous stimulation.  Romberg test negative Deep Tendon Reflexes: +2 throughout, no ankle clonus Plantar responses: downgoing bilaterally Cerebellar: no incoordination on finger to nose, heel to shin. No dysdiadochokinesia Gait: narrow-based and steady, able to tandem walk adequately. Tremor: none  IMPRESSION: This is a pleasant 62 year old right-handed woman with vascular risk factors including hypertension and hyperlipidemia, presenting for evaluation of an abnormal head CT without contrast done in July 2017 which showed a probable old infarction in the left periventricular white matter. Her neurological exam is normal. The scan was reviewed with the patient, images do indicate an old small subcortical infarct, we discussed an MRI brain can better delineate this, but would not change management, as microvascular disease is the typical cause of changes in the subcortical white matter. Discussed continued control of vascular risk factors, goal LDL <100, goal SBP <120-130, continue to monitor glucose control. She is on a daily baby aspirin. All her questions were answered. She knows to go to the ER for any sudden change in symptoms. She will follow-up on a prn basis and knows to call for any changes.   Thank you for allowing me to participate in the care of this patient. Please do not hesitate to call for any questions or concerns.   Patrcia DollyKaren Cattie Tineo, M.D.  CC: Dr. Constance GoltzSchoenhoff

## 2017-11-25 ENCOUNTER — Ambulatory Visit
Admission: RE | Admit: 2017-11-25 | Discharge: 2017-11-25 | Disposition: A | Discharge status: Home / Self Care | Payer: No Typology Code available for payment source | Source: Ambulatory Visit | Attending: Internal Medicine | Admitting: Internal Medicine

## 2017-11-25 DIAGNOSIS — Z1231 Encounter for screening mammogram for malignant neoplasm of breast: Secondary | ICD-10-CM

## 2018-03-21 ENCOUNTER — Other Ambulatory Visit: Payer: Self-pay | Admitting: Internal Medicine

## 2018-03-22 ENCOUNTER — Ambulatory Visit
Admission: RE | Admit: 2018-03-22 | Discharge: 2018-03-22 | Disposition: A | Discharge status: Home / Self Care | Payer: No Typology Code available for payment source | Source: Ambulatory Visit | Attending: Internal Medicine | Admitting: Internal Medicine

## 2018-03-22 ENCOUNTER — Other Ambulatory Visit: Payer: Self-pay | Admitting: Internal Medicine

## 2018-03-22 DIAGNOSIS — R52 Pain, unspecified: Secondary | ICD-10-CM

## 2018-03-22 MED ORDER — IOHEXOL 300 MG/ML  SOLN
100.0000 mL | Freq: Once | INTRAMUSCULAR | Status: AC | PRN
  Administered 2018-03-22: 100 mL via INTRAVENOUS

## 2018-08-10 ENCOUNTER — Other Ambulatory Visit: Payer: Self-pay | Admitting: Interventional Cardiology

## 2018-10-23 ENCOUNTER — Other Ambulatory Visit: Payer: Self-pay | Admitting: Internal Medicine

## 2018-10-23 DIAGNOSIS — Z1231 Encounter for screening mammogram for malignant neoplasm of breast: Secondary | ICD-10-CM

## 2018-11-08 ENCOUNTER — Other Ambulatory Visit: Payer: Self-pay | Admitting: Internal Medicine

## 2018-11-08 ENCOUNTER — Ambulatory Visit
Admission: RE | Admit: 2018-11-08 | Discharge: 2018-11-08 | Disposition: A | Discharge status: Home / Self Care | Payer: No Typology Code available for payment source | Source: Ambulatory Visit | Attending: Internal Medicine | Admitting: Internal Medicine

## 2018-11-08 DIAGNOSIS — R05 Cough: Secondary | ICD-10-CM

## 2018-11-08 DIAGNOSIS — R059 Cough, unspecified: Secondary | ICD-10-CM

## 2018-11-27 ENCOUNTER — Ambulatory Visit
Admission: RE | Admit: 2018-11-27 | Discharge: 2018-11-27 | Disposition: A | Discharge status: Home / Self Care | Payer: PRIVATE HEALTH INSURANCE | Source: Ambulatory Visit | Attending: Internal Medicine | Admitting: Internal Medicine

## 2018-11-27 DIAGNOSIS — Z1231 Encounter for screening mammogram for malignant neoplasm of breast: Secondary | ICD-10-CM

## 2019-07-03 ENCOUNTER — Encounter: Payer: Self-pay | Admitting: Podiatry

## 2019-07-03 ENCOUNTER — Other Ambulatory Visit: Payer: Self-pay

## 2019-07-03 ENCOUNTER — Ambulatory Visit (INDEPENDENT_AMBULATORY_CARE_PROVIDER_SITE_OTHER): Payer: PRIVATE HEALTH INSURANCE | Admitting: Podiatry

## 2019-07-03 VITALS — BP 187/98 | HR 76 | Resp 16

## 2019-07-03 DIAGNOSIS — L603 Nail dystrophy: Secondary | ICD-10-CM

## 2019-07-03 DIAGNOSIS — L600 Ingrowing nail: Secondary | ICD-10-CM | POA: Diagnosis not present

## 2019-07-03 MED ORDER — NEOMYCIN-POLYMYXIN-HC 1 % OT SOLN: OTIC | 1 refills | Status: DC

## 2019-07-03 NOTE — Patient Instructions (Signed)

## 2019-07-04 ENCOUNTER — Encounter: Payer: Self-pay | Admitting: Podiatry

## 2019-07-04 NOTE — Progress Notes (Signed)
Subjective:  Patient ID: Annette Bartlett, female    DOB: 1955/12/04,  MRN: [id number] HPI Chief Complaint  Patient presents with  . Toe Pain    Hallux left - lateral border, tender, red x couple weeks, tried to let nail grow but thinks it made worse  . Diabetes    Unsure of last a1c reading but states "it was kinda high"  . New Patient (Initial Visit)    63 y.o. female presents with the above complaint.   ROS: Denies fever chills nausea vomiting muscle aches pains calf pain back pain chest pain shortness of breath.  Past Medical History:  Diagnosis Date  . Anxiety    takes lexapro  . Carotid stenosis   . Chronic interstitial cystitis   . Complication of anesthesia    slow to awaken after surgery.  . Elevated glucose   . Elevated transaminase level   . Essential hypertension   . GERD (gastroesophageal reflux disease)    occ.  . H/O endometritis   . Hepatic steatosis    hepatomegaly  . Hepatomegaly   . History of kidney stones 08/2013   x2 , 1 passed, 1 litho procedure  . Hypercholesteremia   . Hypertriglyceridemia   . Irritable bowel syndrome    "pain left side"  . Nephrolithiasis   . Osteopenia of right hip   . PONV (postoperative nausea and vomiting)   . Renal calculus 2013  . Vitamin D deficiency    Past Surgical History:  Procedure Laterality Date  . ABDOMINAL HYSTERECTOMY  1984  . CATARACT EXTRACTION, BILATERAL Bilateral   . COLONOSCOPY WITH PROPOFOL N/A 12/23/2015   Procedure: COLONOSCOPY WITH PROPOFOL;  Surgeon: Garlan Fair, MD;  Location: [facility];  Service: Endoscopy;  Laterality: N/A;  . ESOPHAGOGASTRODUODENOSCOPY      Current Outpatient Medications:  .  cholecalciferol (VITAMIN D) 1000 units tablet, Take 1,000 Units by mouth daily., Disp: , Rfl:  .  clidinium-chlordiazePOXIDE (LIBRAX) 5-2.5 MG capsule, Take 1 capsule by mouth., Disp: , Rfl:  .  escitalopram (LEXAPRO) 10 MG tablet, Take 5 mg by mouth every morning. , Disp: , Rfl:  .   estrogens, conjugated, (PREMARIN) 1.25 MG tablet, Take 1.25 mg by mouth daily., Disp: , Rfl:  .  glimepiride (AMARYL) 1 MG tablet, , Disp: , Rfl:  .  LORazepam (ATIVAN) 0.5 MG tablet, Take 0.5 mg by mouth every 8 (eight) hours., Disp: , Rfl:  .  traZODone (DESYREL) 50 MG tablet, Take 25 mg by mouth at bedtime. , Disp: , Rfl:  .  valsartan (DIOVAN) 80 MG tablet, , Disp: , Rfl:  .  vitamin E 400 UNIT capsule, Take 800 Units by mouth daily., Disp: , Rfl:  .  NEOMYCIN-POLYMYXIN-HYDROCORTISONE (CORTISPORIN) 1 % SOLN OTIC solution, Apply 1-2 drops to toe BID after soaking, Disp: 10 mL, Rfl: 1  Allergies  Allergen Reactions  . Amlodipine   . Macrobid [Nitrofurantoin] Nausea And Vomiting  . Metformin And Related   . Sulfa Antibiotics     Upset stomach    Review of Systems Objective:   Vitals:   07/03/19 1513  BP: (!) 187/98  Pulse: 76  Resp: 16    General: Well developed, nourished, in no acute distress, alert and oriented x3   Dermatological: Skin is warm, dry and supple bilateral. Nails x 10 are well maintained; remaining integument appears unremarkable at this time. There are no open sores, no preulcerative lesions, no rash or signs of infection present.  Ingrown toenail  fibular border hallux left mild erythema no purulence no malodor and no drainage.  Vascular: Dorsalis Pedis artery and Posterior Tibial artery pedal pulses are 2/4 bilateral with immedate capillary fill time. Pedal hair growth present. No varicosities and no lower extremity edema present bilateral.   Neruologic: Grossly intact via light touch bilateral. Vibratory intact via tuning fork bilateral. Protective threshold with Semmes Wienstein monofilament intact to all pedal sites bilateral. Patellar and Achilles deep tendon reflexes 2+ bilateral. No Babinski or clonus noted bilateral.   Musculoskeletal: No gross boney pedal deformities bilateral. No pain, crepitus, or limitation noted with foot and ankle range of motion  bilateral. Muscular strength 5/5 in all groups tested bilateral.  Gait: Unassisted, Nonantalgic.    Radiographs:  None taken  Assessment & Plan:   Assessment: Ingrown toenail lateral border hallux.    Plan: Chemical matrixectomy was performed tolerated the procedure well was provided with both oral and written home-going instructions for care and soaking of the toe she was provided a prescription for Cortisporin Otic to be applied twice daily after soaking.  Follow-up with her in 2 weeks     Max T. Percy, [place]

## 2019-07-24 ENCOUNTER — Ambulatory Visit: Payer: PRIVATE HEALTH INSURANCE

## 2019-07-24 ENCOUNTER — Other Ambulatory Visit: Payer: Self-pay

## 2019-07-24 DIAGNOSIS — L600 Ingrowing nail: Secondary | ICD-10-CM

## 2019-07-24 NOTE — Patient Instructions (Signed)

## 2019-07-24 NOTE — Progress Notes (Signed)
Patient is here today for a follow up appt, recent procedure performed on 9.29.20, removal of ingrown toenail. She states that the area is slightly sore but she continues to soak and bandage her toe daily.   No redness, no erythema, no swelling, no drainage, no other s/s of infection. The area is scabbed over and healing well at this time.    Discussed s/s of infection, verbal and written instructions were given. She is to follow up with any acute symptom changes 

## 2019-08-01 ENCOUNTER — Other Ambulatory Visit: Payer: Self-pay | Admitting: Internal Medicine

## 2019-08-01 DIAGNOSIS — E2839 Other primary ovarian failure: Secondary | ICD-10-CM

## 2019-09-14 ENCOUNTER — Other Ambulatory Visit: Payer: Self-pay | Admitting: Gastroenterology

## 2019-09-14 DIAGNOSIS — R109 Unspecified abdominal pain: Secondary | ICD-10-CM

## 2019-09-18 ENCOUNTER — Ambulatory Visit
Admission: RE | Admit: 2019-09-18 | Discharge: 2019-09-18 | Disposition: A | Discharge status: Home / Self Care | Payer: PRIVATE HEALTH INSURANCE | Source: Ambulatory Visit | Attending: Gastroenterology | Admitting: Gastroenterology

## 2019-09-18 DIAGNOSIS — R109 Unspecified abdominal pain: Secondary | ICD-10-CM

## 2019-09-18 MED ORDER — IOPAMIDOL (ISOVUE-300) INJECTION 61%
100.0000 mL | Freq: Once | INTRAVENOUS | Status: AC | PRN
  Administered 2019-09-18: 100 mL via INTRAVENOUS

## 2019-10-10 ENCOUNTER — Ambulatory Visit
Admission: RE | Admit: 2019-10-10 | Discharge: 2019-10-10 | Disposition: A | Discharge status: Home / Self Care | Payer: PRIVATE HEALTH INSURANCE | Source: Ambulatory Visit | Attending: Internal Medicine | Admitting: Internal Medicine

## 2019-10-10 ENCOUNTER — Other Ambulatory Visit: Payer: Self-pay

## 2019-10-10 DIAGNOSIS — E2839 Other primary ovarian failure: Secondary | ICD-10-CM

## 2019-10-19 ENCOUNTER — Other Ambulatory Visit: Payer: Self-pay | Admitting: Internal Medicine

## 2019-10-19 DIAGNOSIS — Z1231 Encounter for screening mammogram for malignant neoplasm of breast: Secondary | ICD-10-CM

## 2019-11-29 ENCOUNTER — Ambulatory Visit
Admission: RE | Admit: 2019-11-29 | Discharge: 2019-11-29 | Disposition: A | Discharge status: Home / Self Care | Payer: PRIVATE HEALTH INSURANCE | Source: Ambulatory Visit | Attending: Internal Medicine | Admitting: Internal Medicine

## 2019-11-29 ENCOUNTER — Other Ambulatory Visit: Payer: Self-pay

## 2019-11-29 DIAGNOSIS — Z1231 Encounter for screening mammogram for malignant neoplasm of breast: Secondary | ICD-10-CM

## 2020-10-21 DIAGNOSIS — E1169 Type 2 diabetes mellitus with other specified complication: Secondary | ICD-10-CM | POA: Diagnosis not present

## 2020-10-21 DIAGNOSIS — E11319 Type 2 diabetes mellitus with unspecified diabetic retinopathy without macular edema: Secondary | ICD-10-CM | POA: Diagnosis not present

## 2020-10-21 DIAGNOSIS — I10 Essential (primary) hypertension: Secondary | ICD-10-CM | POA: Diagnosis not present

## 2020-10-21 DIAGNOSIS — E1165 Type 2 diabetes mellitus with hyperglycemia: Secondary | ICD-10-CM | POA: Diagnosis not present

## 2020-10-21 DIAGNOSIS — E7800 Pure hypercholesterolemia, unspecified: Secondary | ICD-10-CM | POA: Diagnosis not present

## 2020-10-21 DIAGNOSIS — G8929 Other chronic pain: Secondary | ICD-10-CM | POA: Diagnosis not present

## 2020-10-21 DIAGNOSIS — K219 Gastro-esophageal reflux disease without esophagitis: Secondary | ICD-10-CM | POA: Diagnosis not present

## 2020-10-22 ENCOUNTER — Other Ambulatory Visit: Payer: Self-pay | Admitting: Internal Medicine

## 2020-10-22 DIAGNOSIS — Z1231 Encounter for screening mammogram for malignant neoplasm of breast: Secondary | ICD-10-CM

## 2020-12-03 DIAGNOSIS — E1169 Type 2 diabetes mellitus with other specified complication: Secondary | ICD-10-CM | POA: Diagnosis not present

## 2020-12-03 DIAGNOSIS — K219 Gastro-esophageal reflux disease without esophagitis: Secondary | ICD-10-CM | POA: Diagnosis not present

## 2020-12-03 DIAGNOSIS — I10 Essential (primary) hypertension: Secondary | ICD-10-CM | POA: Diagnosis not present

## 2020-12-03 DIAGNOSIS — E1165 Type 2 diabetes mellitus with hyperglycemia: Secondary | ICD-10-CM | POA: Diagnosis not present

## 2020-12-03 DIAGNOSIS — E11319 Type 2 diabetes mellitus with unspecified diabetic retinopathy without macular edema: Secondary | ICD-10-CM | POA: Diagnosis not present

## 2020-12-03 DIAGNOSIS — E7800 Pure hypercholesterolemia, unspecified: Secondary | ICD-10-CM | POA: Diagnosis not present

## 2020-12-03 DIAGNOSIS — G8929 Other chronic pain: Secondary | ICD-10-CM | POA: Diagnosis not present

## 2020-12-04 ENCOUNTER — Ambulatory Visit
Admission: RE | Admit: 2020-12-04 | Discharge: 2020-12-04 | Disposition: A | Discharge status: Home / Self Care | Payer: Medicare Other | Source: Ambulatory Visit | Attending: Internal Medicine | Admitting: Internal Medicine

## 2020-12-04 DIAGNOSIS — Z1231 Encounter for screening mammogram for malignant neoplasm of breast: Secondary | ICD-10-CM

## 2021-01-30 DIAGNOSIS — I10 Essential (primary) hypertension: Secondary | ICD-10-CM | POA: Diagnosis not present

## 2021-01-30 DIAGNOSIS — Z7984 Long term (current) use of oral hypoglycemic drugs: Secondary | ICD-10-CM | POA: Diagnosis not present

## 2021-01-30 DIAGNOSIS — E7800 Pure hypercholesterolemia, unspecified: Secondary | ICD-10-CM | POA: Diagnosis not present

## 2021-01-30 DIAGNOSIS — E11319 Type 2 diabetes mellitus with unspecified diabetic retinopathy without macular edema: Secondary | ICD-10-CM | POA: Diagnosis not present

## 2021-01-30 DIAGNOSIS — E1169 Type 2 diabetes mellitus with other specified complication: Secondary | ICD-10-CM | POA: Diagnosis not present

## 2021-01-30 DIAGNOSIS — Z8673 Personal history of transient ischemic attack (TIA), and cerebral infarction without residual deficits: Secondary | ICD-10-CM | POA: Diagnosis not present

## 2021-01-30 DIAGNOSIS — R350 Frequency of micturition: Secondary | ICD-10-CM | POA: Diagnosis not present

## 2021-02-09 DIAGNOSIS — G8929 Other chronic pain: Secondary | ICD-10-CM | POA: Diagnosis not present

## 2021-02-09 DIAGNOSIS — K219 Gastro-esophageal reflux disease without esophagitis: Secondary | ICD-10-CM | POA: Diagnosis not present

## 2021-02-09 DIAGNOSIS — E7800 Pure hypercholesterolemia, unspecified: Secondary | ICD-10-CM | POA: Diagnosis not present

## 2021-02-09 DIAGNOSIS — E1169 Type 2 diabetes mellitus with other specified complication: Secondary | ICD-10-CM | POA: Diagnosis not present

## 2021-02-09 DIAGNOSIS — E11319 Type 2 diabetes mellitus with unspecified diabetic retinopathy without macular edema: Secondary | ICD-10-CM | POA: Diagnosis not present

## 2021-02-09 DIAGNOSIS — E1165 Type 2 diabetes mellitus with hyperglycemia: Secondary | ICD-10-CM | POA: Diagnosis not present

## 2021-02-09 DIAGNOSIS — I10 Essential (primary) hypertension: Secondary | ICD-10-CM | POA: Diagnosis not present

## 2021-02-10 DIAGNOSIS — R300 Dysuria: Secondary | ICD-10-CM | POA: Diagnosis not present

## 2021-04-27 DIAGNOSIS — G8929 Other chronic pain: Secondary | ICD-10-CM | POA: Diagnosis not present

## 2021-04-27 DIAGNOSIS — E7800 Pure hypercholesterolemia, unspecified: Secondary | ICD-10-CM | POA: Diagnosis not present

## 2021-04-27 DIAGNOSIS — E1165 Type 2 diabetes mellitus with hyperglycemia: Secondary | ICD-10-CM | POA: Diagnosis not present

## 2021-04-27 DIAGNOSIS — I10 Essential (primary) hypertension: Secondary | ICD-10-CM | POA: Diagnosis not present

## 2021-04-27 DIAGNOSIS — K219 Gastro-esophageal reflux disease without esophagitis: Secondary | ICD-10-CM | POA: Diagnosis not present

## 2021-04-27 DIAGNOSIS — E1169 Type 2 diabetes mellitus with other specified complication: Secondary | ICD-10-CM | POA: Diagnosis not present

## 2021-04-27 DIAGNOSIS — E11319 Type 2 diabetes mellitus with unspecified diabetic retinopathy without macular edema: Secondary | ICD-10-CM | POA: Diagnosis not present

## 2021-05-22 DIAGNOSIS — E11319 Type 2 diabetes mellitus with unspecified diabetic retinopathy without macular edema: Secondary | ICD-10-CM | POA: Diagnosis not present

## 2021-05-22 DIAGNOSIS — I10 Essential (primary) hypertension: Secondary | ICD-10-CM | POA: Diagnosis not present

## 2021-05-22 DIAGNOSIS — K219 Gastro-esophageal reflux disease without esophagitis: Secondary | ICD-10-CM | POA: Diagnosis not present

## 2021-05-22 DIAGNOSIS — E7800 Pure hypercholesterolemia, unspecified: Secondary | ICD-10-CM | POA: Diagnosis not present

## 2021-05-22 DIAGNOSIS — E1169 Type 2 diabetes mellitus with other specified complication: Secondary | ICD-10-CM | POA: Diagnosis not present

## 2021-05-22 DIAGNOSIS — G8929 Other chronic pain: Secondary | ICD-10-CM | POA: Diagnosis not present

## 2021-05-22 DIAGNOSIS — E1165 Type 2 diabetes mellitus with hyperglycemia: Secondary | ICD-10-CM | POA: Diagnosis not present

## 2021-06-26 ENCOUNTER — Other Ambulatory Visit: Payer: Self-pay | Admitting: Internal Medicine

## 2021-06-26 ENCOUNTER — Ambulatory Visit
Admission: RE | Admit: 2021-06-26 | Discharge: 2021-06-26 | Disposition: A | Discharge status: Home / Self Care | Payer: Medicare Other | Source: Ambulatory Visit | Attending: Internal Medicine | Admitting: Internal Medicine

## 2021-06-26 ENCOUNTER — Other Ambulatory Visit: Payer: Self-pay

## 2021-06-26 DIAGNOSIS — R1010 Upper abdominal pain, unspecified: Secondary | ICD-10-CM | POA: Diagnosis not present

## 2021-06-26 DIAGNOSIS — K588 Other irritable bowel syndrome: Secondary | ICD-10-CM | POA: Diagnosis not present

## 2021-06-26 DIAGNOSIS — Z87442 Personal history of urinary calculi: Secondary | ICD-10-CM | POA: Diagnosis not present

## 2021-06-26 DIAGNOSIS — R109 Unspecified abdominal pain: Secondary | ICD-10-CM | POA: Diagnosis not present

## 2021-08-03 DIAGNOSIS — E11319 Type 2 diabetes mellitus with unspecified diabetic retinopathy without macular edema: Secondary | ICD-10-CM | POA: Diagnosis not present

## 2021-08-03 DIAGNOSIS — E1169 Type 2 diabetes mellitus with other specified complication: Secondary | ICD-10-CM | POA: Diagnosis not present

## 2021-08-03 DIAGNOSIS — Z8673 Personal history of transient ischemic attack (TIA), and cerebral infarction without residual deficits: Secondary | ICD-10-CM | POA: Diagnosis not present

## 2021-08-03 DIAGNOSIS — K760 Fatty (change of) liver, not elsewhere classified: Secondary | ICD-10-CM | POA: Diagnosis not present

## 2021-08-03 DIAGNOSIS — E559 Vitamin D deficiency, unspecified: Secondary | ICD-10-CM | POA: Diagnosis not present

## 2021-08-03 DIAGNOSIS — Z789 Other specified health status: Secondary | ICD-10-CM | POA: Diagnosis not present

## 2021-08-03 DIAGNOSIS — I10 Essential (primary) hypertension: Secondary | ICD-10-CM | POA: Diagnosis not present

## 2021-08-03 DIAGNOSIS — E1165 Type 2 diabetes mellitus with hyperglycemia: Secondary | ICD-10-CM | POA: Diagnosis not present

## 2021-08-03 DIAGNOSIS — Z23 Encounter for immunization: Secondary | ICD-10-CM | POA: Diagnosis not present

## 2021-08-03 DIAGNOSIS — G8929 Other chronic pain: Secondary | ICD-10-CM | POA: Diagnosis not present

## 2021-08-03 DIAGNOSIS — E7800 Pure hypercholesterolemia, unspecified: Secondary | ICD-10-CM | POA: Diagnosis not present

## 2021-08-03 DIAGNOSIS — Z0000 Encounter for general adult medical examination without abnormal findings: Secondary | ICD-10-CM | POA: Diagnosis not present

## 2021-08-03 DIAGNOSIS — Z7984 Long term (current) use of oral hypoglycemic drugs: Secondary | ICD-10-CM | POA: Diagnosis not present

## 2021-08-03 DIAGNOSIS — E113293 Type 2 diabetes mellitus with mild nonproliferative diabetic retinopathy without macular edema, bilateral: Secondary | ICD-10-CM | POA: Diagnosis not present

## 2021-08-03 DIAGNOSIS — K219 Gastro-esophageal reflux disease without esophagitis: Secondary | ICD-10-CM | POA: Diagnosis not present

## 2021-08-06 DIAGNOSIS — Z8673 Personal history of transient ischemic attack (TIA), and cerebral infarction without residual deficits: Secondary | ICD-10-CM | POA: Diagnosis not present

## 2021-08-06 DIAGNOSIS — I10 Essential (primary) hypertension: Secondary | ICD-10-CM | POA: Diagnosis not present

## 2021-08-06 DIAGNOSIS — E113293 Type 2 diabetes mellitus with mild nonproliferative diabetic retinopathy without macular edema, bilateral: Secondary | ICD-10-CM | POA: Diagnosis not present

## 2021-08-21 DIAGNOSIS — G8929 Other chronic pain: Secondary | ICD-10-CM | POA: Diagnosis not present

## 2021-08-21 DIAGNOSIS — I10 Essential (primary) hypertension: Secondary | ICD-10-CM | POA: Diagnosis not present

## 2021-08-21 DIAGNOSIS — E7800 Pure hypercholesterolemia, unspecified: Secondary | ICD-10-CM | POA: Diagnosis not present

## 2021-08-21 DIAGNOSIS — K219 Gastro-esophageal reflux disease without esophagitis: Secondary | ICD-10-CM | POA: Diagnosis not present

## 2021-08-21 DIAGNOSIS — E1169 Type 2 diabetes mellitus with other specified complication: Secondary | ICD-10-CM | POA: Diagnosis not present

## 2021-08-21 DIAGNOSIS — E1165 Type 2 diabetes mellitus with hyperglycemia: Secondary | ICD-10-CM | POA: Diagnosis not present

## 2021-08-21 DIAGNOSIS — E11319 Type 2 diabetes mellitus with unspecified diabetic retinopathy without macular edema: Secondary | ICD-10-CM | POA: Diagnosis not present

## 2021-09-22 DIAGNOSIS — E119 Type 2 diabetes mellitus without complications: Secondary | ICD-10-CM | POA: Diagnosis not present

## 2021-09-22 DIAGNOSIS — H52203 Unspecified astigmatism, bilateral: Secondary | ICD-10-CM | POA: Diagnosis not present

## 2021-09-22 DIAGNOSIS — Z961 Presence of intraocular lens: Secondary | ICD-10-CM | POA: Diagnosis not present

## 2021-10-26 ENCOUNTER — Other Ambulatory Visit: Payer: Self-pay | Admitting: Internal Medicine

## 2021-10-26 DIAGNOSIS — Z1231 Encounter for screening mammogram for malignant neoplasm of breast: Secondary | ICD-10-CM

## 2021-11-03 DIAGNOSIS — E1169 Type 2 diabetes mellitus with other specified complication: Secondary | ICD-10-CM | POA: Diagnosis not present

## 2021-11-03 DIAGNOSIS — E7800 Pure hypercholesterolemia, unspecified: Secondary | ICD-10-CM | POA: Diagnosis not present

## 2021-11-03 DIAGNOSIS — I10 Essential (primary) hypertension: Secondary | ICD-10-CM | POA: Diagnosis not present

## 2021-11-03 DIAGNOSIS — G8929 Other chronic pain: Secondary | ICD-10-CM | POA: Diagnosis not present

## 2021-11-03 DIAGNOSIS — E1165 Type 2 diabetes mellitus with hyperglycemia: Secondary | ICD-10-CM | POA: Diagnosis not present

## 2021-11-03 DIAGNOSIS — K219 Gastro-esophageal reflux disease without esophagitis: Secondary | ICD-10-CM | POA: Diagnosis not present

## 2021-11-03 DIAGNOSIS — E11319 Type 2 diabetes mellitus with unspecified diabetic retinopathy without macular edema: Secondary | ICD-10-CM | POA: Diagnosis not present

## 2021-12-07 ENCOUNTER — Ambulatory Visit: Payer: Medicare Other

## 2021-12-08 ENCOUNTER — Ambulatory Visit
Admission: RE | Admit: 2021-12-08 | Discharge: 2021-12-08 | Disposition: A | Discharge status: Home / Self Care | Payer: Medicare Other | Source: Ambulatory Visit | Attending: Internal Medicine | Admitting: Internal Medicine

## 2021-12-08 DIAGNOSIS — Z1231 Encounter for screening mammogram for malignant neoplasm of breast: Secondary | ICD-10-CM | POA: Diagnosis not present

## 2022-01-27 DIAGNOSIS — G720 Drug-induced myopathy: Secondary | ICD-10-CM | POA: Diagnosis not present

## 2022-01-27 DIAGNOSIS — I10 Essential (primary) hypertension: Secondary | ICD-10-CM | POA: Diagnosis not present

## 2022-01-27 DIAGNOSIS — R5383 Other fatigue: Secondary | ICD-10-CM | POA: Diagnosis not present

## 2022-01-27 DIAGNOSIS — E7800 Pure hypercholesterolemia, unspecified: Secondary | ICD-10-CM | POA: Diagnosis not present

## 2022-01-27 DIAGNOSIS — M2550 Pain in unspecified joint: Secondary | ICD-10-CM | POA: Diagnosis not present

## 2022-01-27 DIAGNOSIS — M25521 Pain in right elbow: Secondary | ICD-10-CM | POA: Diagnosis not present

## 2022-01-27 DIAGNOSIS — E113293 Type 2 diabetes mellitus with mild nonproliferative diabetic retinopathy without macular edema, bilateral: Secondary | ICD-10-CM | POA: Diagnosis not present

## 2022-03-17 DIAGNOSIS — G8929 Other chronic pain: Secondary | ICD-10-CM | POA: Diagnosis not present

## 2022-03-17 DIAGNOSIS — I10 Essential (primary) hypertension: Secondary | ICD-10-CM | POA: Diagnosis not present

## 2022-03-17 DIAGNOSIS — E7800 Pure hypercholesterolemia, unspecified: Secondary | ICD-10-CM | POA: Diagnosis not present

## 2022-03-17 DIAGNOSIS — E1169 Type 2 diabetes mellitus with other specified complication: Secondary | ICD-10-CM | POA: Diagnosis not present

## 2022-03-17 DIAGNOSIS — K219 Gastro-esophageal reflux disease without esophagitis: Secondary | ICD-10-CM | POA: Diagnosis not present

## 2022-06-22 DIAGNOSIS — Z9189 Other specified personal risk factors, not elsewhere classified: Secondary | ICD-10-CM | POA: Diagnosis not present

## 2022-08-11 DIAGNOSIS — E7800 Pure hypercholesterolemia, unspecified: Secondary | ICD-10-CM | POA: Diagnosis not present

## 2022-08-11 DIAGNOSIS — E11319 Type 2 diabetes mellitus with unspecified diabetic retinopathy without macular edema: Secondary | ICD-10-CM | POA: Diagnosis not present

## 2022-08-11 DIAGNOSIS — I10 Essential (primary) hypertension: Secondary | ICD-10-CM | POA: Diagnosis not present

## 2022-08-11 DIAGNOSIS — E113293 Type 2 diabetes mellitus with mild nonproliferative diabetic retinopathy without macular edema, bilateral: Secondary | ICD-10-CM | POA: Diagnosis not present

## 2022-08-11 DIAGNOSIS — Z0000 Encounter for general adult medical examination without abnormal findings: Secondary | ICD-10-CM | POA: Diagnosis not present

## 2022-08-11 DIAGNOSIS — R109 Unspecified abdominal pain: Secondary | ICD-10-CM | POA: Diagnosis not present

## 2022-08-11 DIAGNOSIS — K760 Fatty (change of) liver, not elsewhere classified: Secondary | ICD-10-CM | POA: Diagnosis not present

## 2022-08-11 DIAGNOSIS — Z5181 Encounter for therapeutic drug level monitoring: Secondary | ICD-10-CM | POA: Diagnosis not present

## 2022-08-11 DIAGNOSIS — G720 Drug-induced myopathy: Secondary | ICD-10-CM | POA: Diagnosis not present

## 2022-08-11 DIAGNOSIS — I700 Atherosclerosis of aorta: Secondary | ICD-10-CM | POA: Diagnosis not present

## 2022-08-21 IMAGING — MG MM DIGITAL SCREENING BILAT W/ TOMO AND CAD
6 of 10 series · 6 of 30 positions shown · non-contrast
Comparison: Previous exam(s).

CLINICAL DATA: Screening.

EXAM:
DIGITAL SCREENING BILATERAL MAMMOGRAM WITH TOMOSYNTHESIS AND CAD
TECHNIQUE: Bilateral screening digital craniocaudal and mediolateral oblique
mammograms were obtained. Bilateral screening digital breast
tomosynthesis was performed. The images were evaluated with
computer-aided detection.

[L MLO synth-2D (1 of 2)]
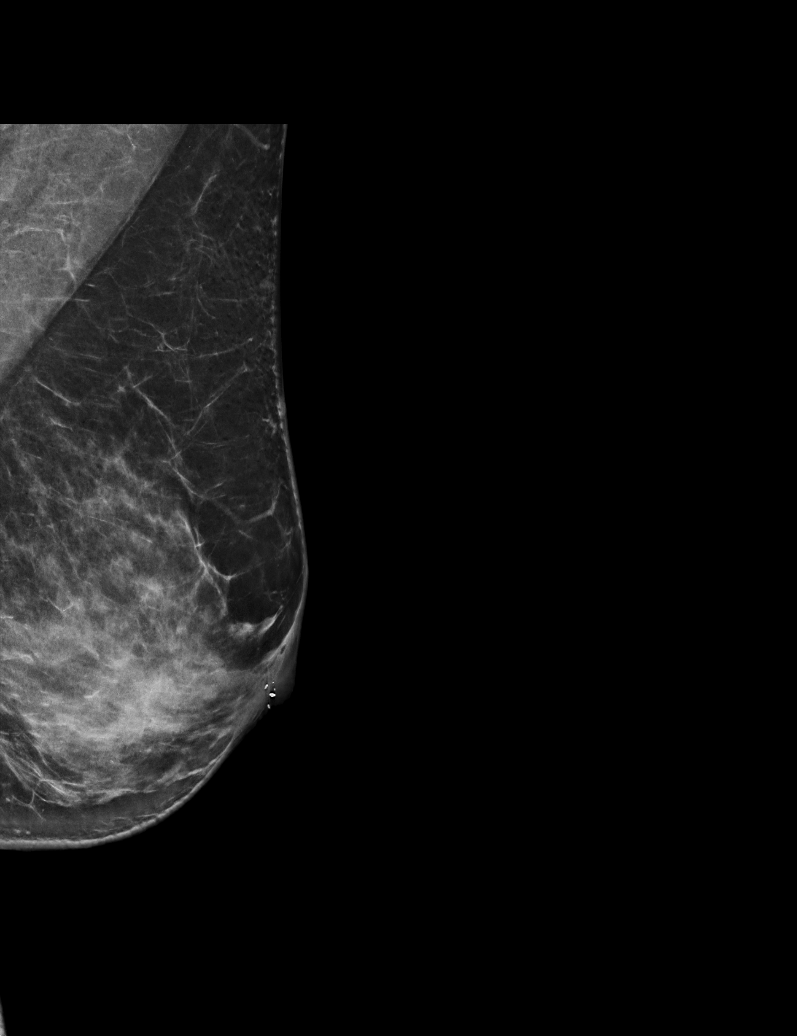

[L CC synth-2D]
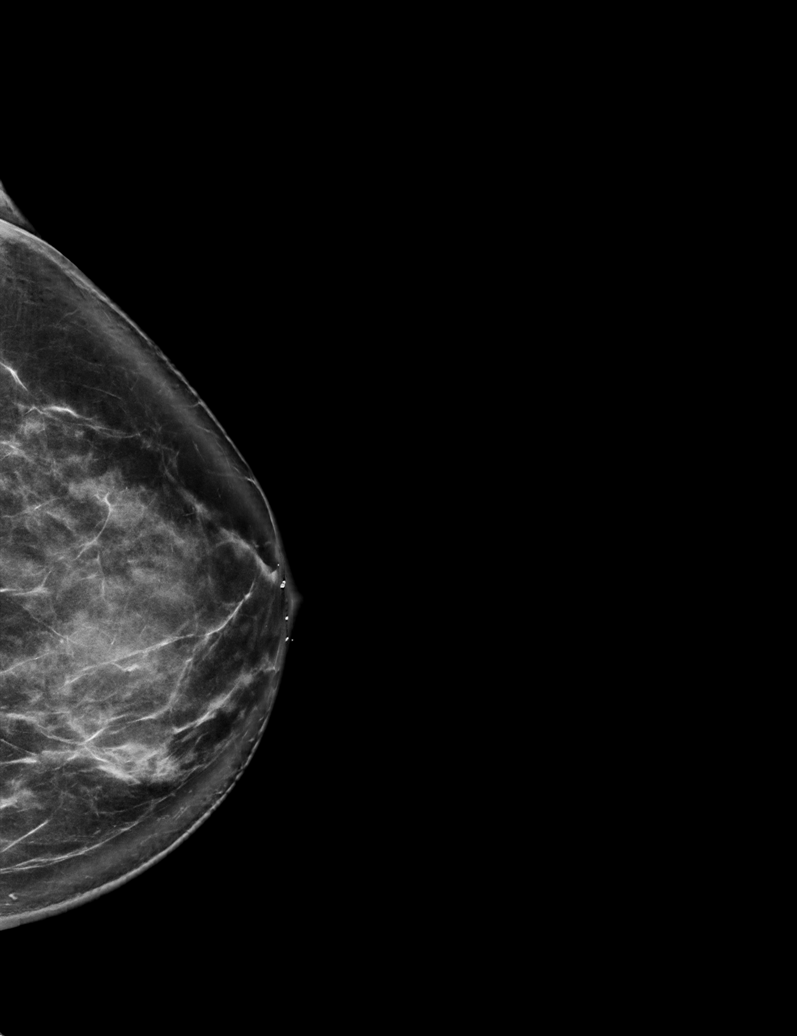

[R MLO synth-2D]
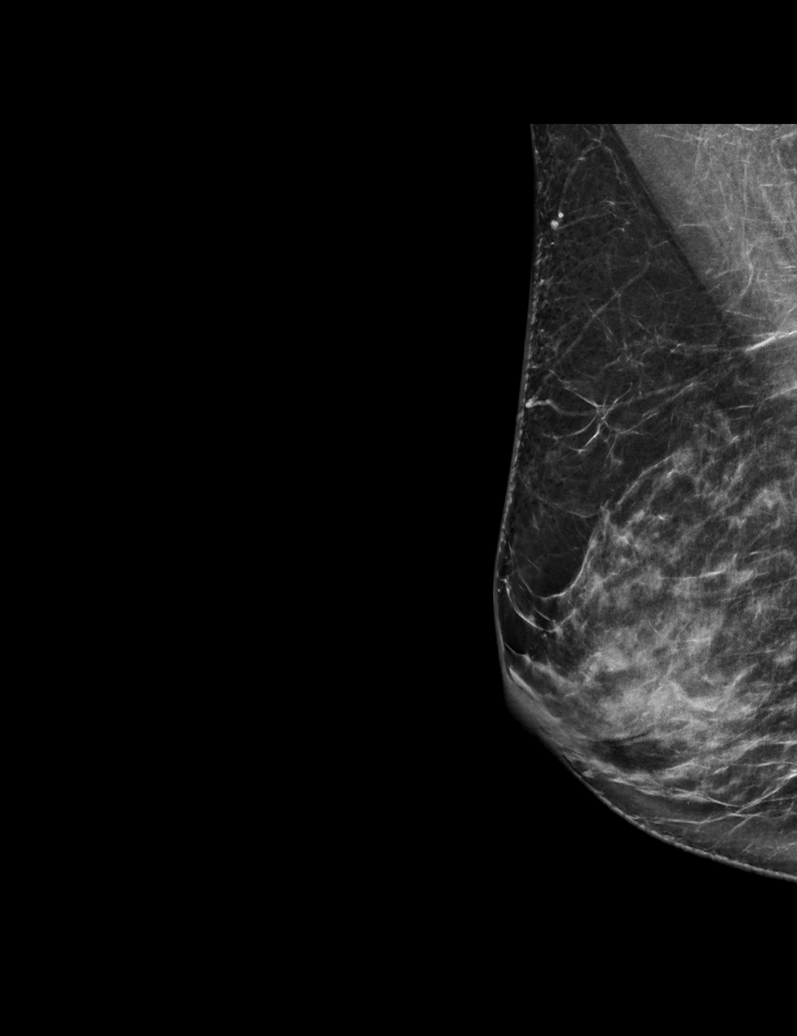

[R CC synth-2D]
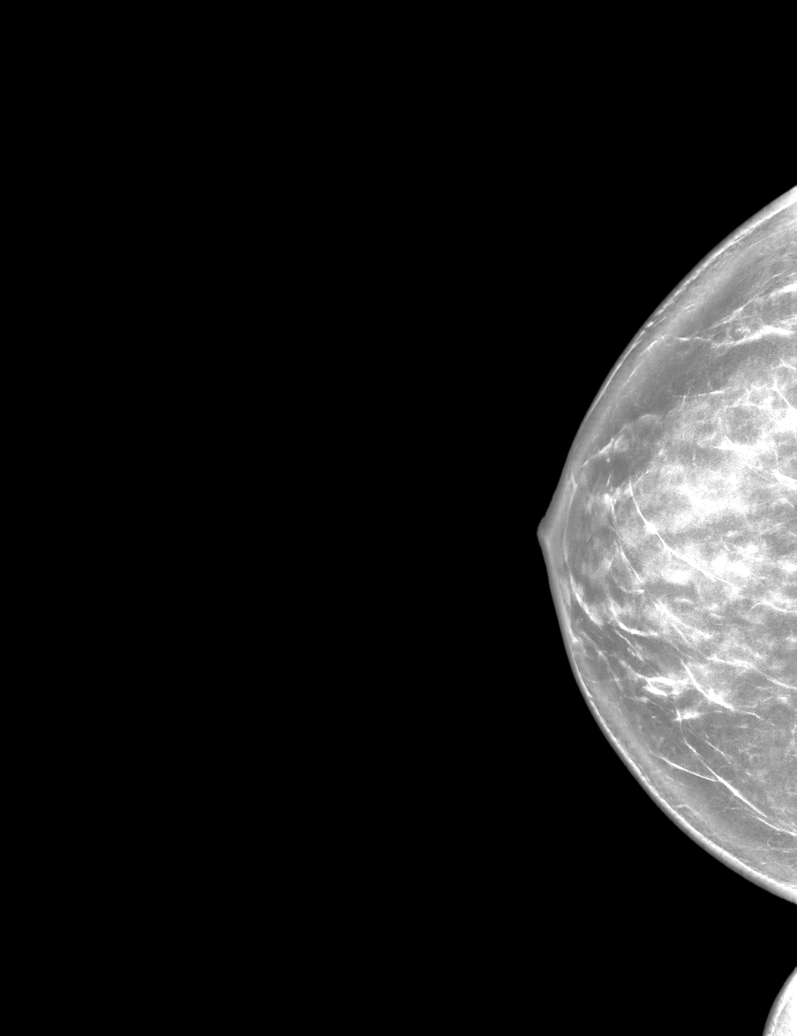

[L MLO synth-2D (2 of 2)]
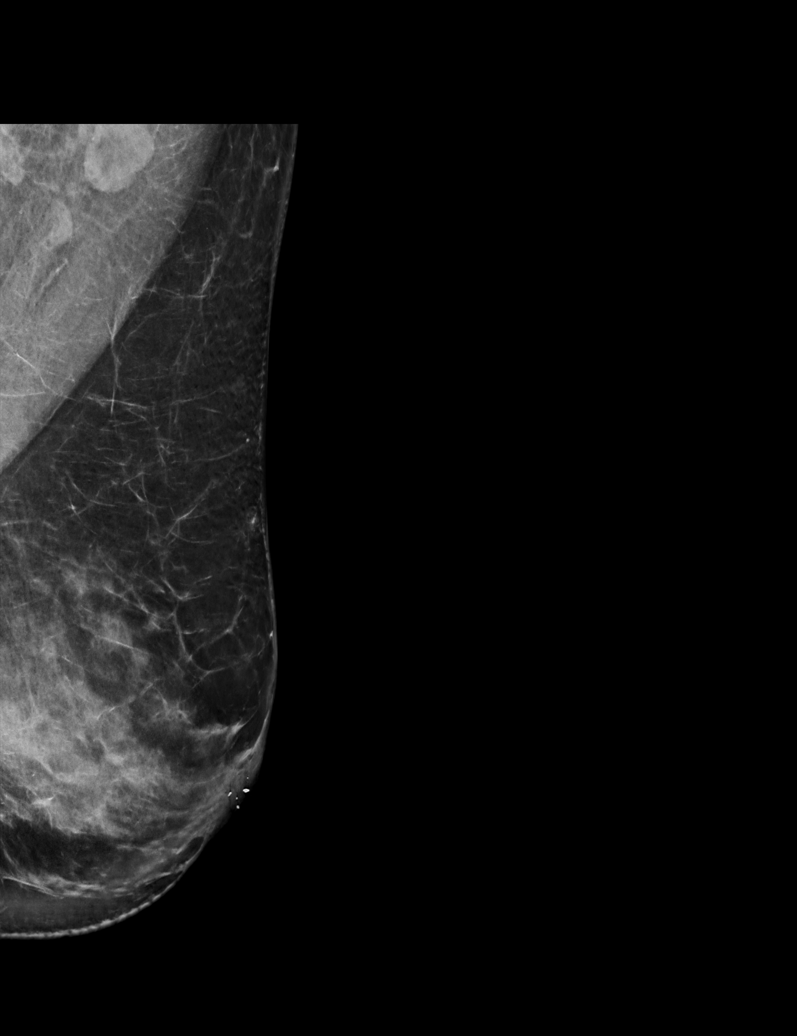

[R CC tomo · tomo slice 37/72.0]
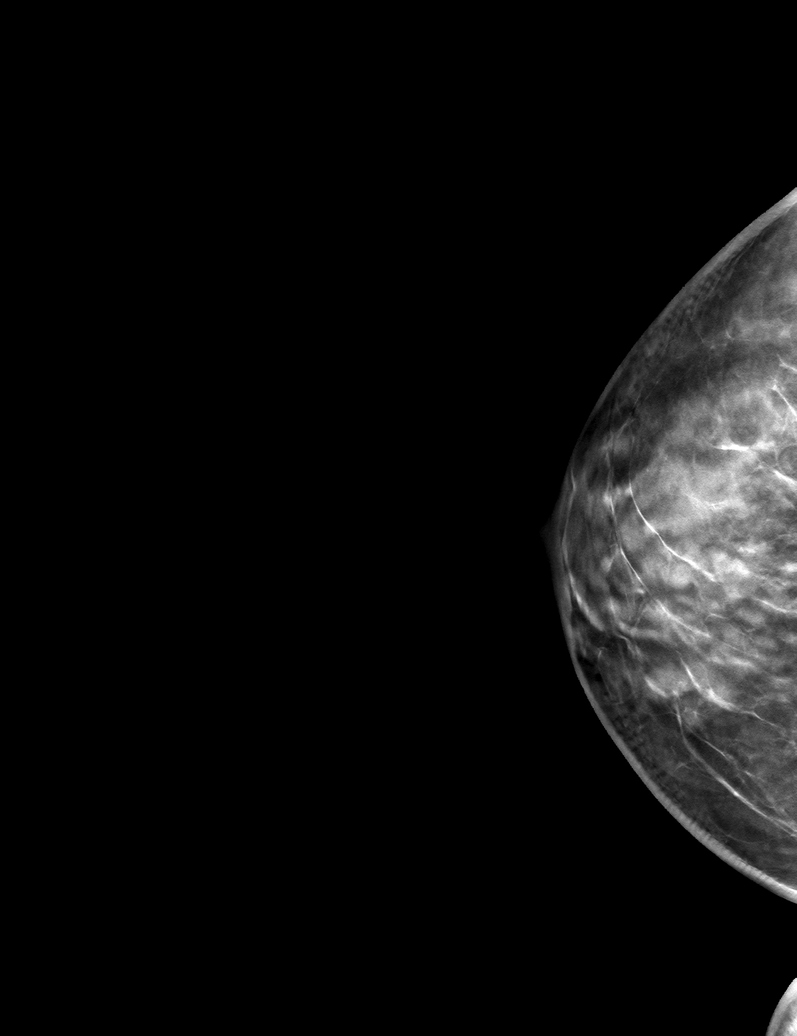

[6 of 30 positions shown; findings below may reference images not displayed]

ACR Breast Density Category d: The breast tissue is extremely dense,
which lowers the sensitivity of mammography
FINDINGS: There are no findings suspicious for malignancy.
IMPRESSION: No mammographic evidence of malignancy. A result letter of this
screening mammogram will be mailed directly to the patient.

RECOMMENDATION:
Screening mammogram in one year. (Code:TA-V-WV9)

BI-RADS CATEGORY  1: Negative.

## 2022-10-04 DIAGNOSIS — U071 COVID-19: Secondary | ICD-10-CM | POA: Diagnosis not present

## 2022-10-04 DIAGNOSIS — R509 Fever, unspecified: Secondary | ICD-10-CM | POA: Diagnosis not present

## 2022-10-27 ENCOUNTER — Other Ambulatory Visit: Payer: Self-pay | Admitting: Gastroenterology

## 2022-10-27 DIAGNOSIS — R7989 Other specified abnormal findings of blood chemistry: Secondary | ICD-10-CM

## 2022-10-27 DIAGNOSIS — R1319 Other dysphagia: Secondary | ICD-10-CM | POA: Diagnosis not present

## 2022-10-27 DIAGNOSIS — R1012 Left upper quadrant pain: Secondary | ICD-10-CM | POA: Diagnosis not present

## 2022-11-04 ENCOUNTER — Other Ambulatory Visit: Payer: Self-pay | Admitting: Internal Medicine

## 2022-11-04 DIAGNOSIS — Z1231 Encounter for screening mammogram for malignant neoplasm of breast: Secondary | ICD-10-CM

## 2022-11-04 DIAGNOSIS — Z8616 Personal history of COVID-19: Secondary | ICD-10-CM | POA: Diagnosis not present

## 2022-11-04 DIAGNOSIS — J029 Acute pharyngitis, unspecified: Secondary | ICD-10-CM | POA: Diagnosis not present

## 2022-11-17 DIAGNOSIS — K219 Gastro-esophageal reflux disease without esophagitis: Secondary | ICD-10-CM | POA: Diagnosis not present

## 2022-11-17 DIAGNOSIS — K2980 Duodenitis without bleeding: Secondary | ICD-10-CM | POA: Diagnosis not present

## 2022-11-17 DIAGNOSIS — R1012 Left upper quadrant pain: Secondary | ICD-10-CM | POA: Diagnosis not present

## 2022-11-17 DIAGNOSIS — K2930 Chronic superficial gastritis without bleeding: Secondary | ICD-10-CM | POA: Diagnosis not present

## 2022-11-17 DIAGNOSIS — K222 Esophageal obstruction: Secondary | ICD-10-CM | POA: Diagnosis not present

## 2022-11-17 DIAGNOSIS — K3189 Other diseases of stomach and duodenum: Secondary | ICD-10-CM | POA: Diagnosis not present

## 2022-11-17 DIAGNOSIS — R1310 Dysphagia, unspecified: Secondary | ICD-10-CM | POA: Diagnosis not present

## 2022-11-19 ENCOUNTER — Ambulatory Visit
Admission: RE | Admit: 2022-11-19 | Discharge: 2022-11-19 | Disposition: A | Discharge status: Home / Self Care | Payer: Medicare Other | Source: Ambulatory Visit | Attending: Gastroenterology | Admitting: Gastroenterology

## 2022-11-19 DIAGNOSIS — R7989 Other specified abnormal findings of blood chemistry: Secondary | ICD-10-CM

## 2022-11-24 DIAGNOSIS — K219 Gastro-esophageal reflux disease without esophagitis: Secondary | ICD-10-CM | POA: Diagnosis not present

## 2022-11-24 DIAGNOSIS — K2980 Duodenitis without bleeding: Secondary | ICD-10-CM | POA: Diagnosis not present

## 2022-11-24 DIAGNOSIS — K2930 Chronic superficial gastritis without bleeding: Secondary | ICD-10-CM | POA: Diagnosis not present

## 2022-12-13 ENCOUNTER — Ambulatory Visit
Admission: RE | Admit: 2022-12-13 | Discharge: 2022-12-13 | Disposition: A | Discharge status: Home / Self Care | Payer: Medicare Other | Source: Ambulatory Visit | Attending: Internal Medicine | Admitting: Internal Medicine

## 2022-12-13 DIAGNOSIS — Z1231 Encounter for screening mammogram for malignant neoplasm of breast: Secondary | ICD-10-CM | POA: Diagnosis not present

## 2022-12-15 ENCOUNTER — Other Ambulatory Visit: Payer: Self-pay | Admitting: Internal Medicine

## 2022-12-15 DIAGNOSIS — R928 Other abnormal and inconclusive findings on diagnostic imaging of breast: Secondary | ICD-10-CM

## 2022-12-28 ENCOUNTER — Ambulatory Visit
Admission: RE | Admit: 2022-12-28 | Discharge: 2022-12-28 | Disposition: A | Discharge status: Home / Self Care | Payer: Medicare Other | Source: Ambulatory Visit | Attending: Internal Medicine | Admitting: Internal Medicine

## 2022-12-28 DIAGNOSIS — R928 Other abnormal and inconclusive findings on diagnostic imaging of breast: Secondary | ICD-10-CM

## 2023-01-19 DIAGNOSIS — Z961 Presence of intraocular lens: Secondary | ICD-10-CM | POA: Diagnosis not present

## 2023-01-19 DIAGNOSIS — H5213 Myopia, bilateral: Secondary | ICD-10-CM | POA: Diagnosis not present

## 2023-01-19 DIAGNOSIS — E119 Type 2 diabetes mellitus without complications: Secondary | ICD-10-CM | POA: Diagnosis not present

## 2023-02-10 DIAGNOSIS — E1165 Type 2 diabetes mellitus with hyperglycemia: Secondary | ICD-10-CM | POA: Diagnosis not present

## 2023-02-10 DIAGNOSIS — I10 Essential (primary) hypertension: Secondary | ICD-10-CM | POA: Diagnosis not present

## 2023-02-10 DIAGNOSIS — I700 Atherosclerosis of aorta: Secondary | ICD-10-CM | POA: Diagnosis not present

## 2023-02-10 DIAGNOSIS — G720 Drug-induced myopathy: Secondary | ICD-10-CM | POA: Diagnosis not present

## 2023-02-10 DIAGNOSIS — E7800 Pure hypercholesterolemia, unspecified: Secondary | ICD-10-CM | POA: Diagnosis not present

## 2023-03-29 DIAGNOSIS — R300 Dysuria: Secondary | ICD-10-CM | POA: Diagnosis not present

## 2023-03-29 DIAGNOSIS — R350 Frequency of micturition: Secondary | ICD-10-CM | POA: Diagnosis not present

## 2023-08-01 DIAGNOSIS — Z23 Encounter for immunization: Secondary | ICD-10-CM | POA: Diagnosis not present

## 2023-09-05 DIAGNOSIS — E559 Vitamin D deficiency, unspecified: Secondary | ICD-10-CM | POA: Diagnosis not present

## 2023-09-05 DIAGNOSIS — L80 Vitiligo: Secondary | ICD-10-CM | POA: Diagnosis not present

## 2023-09-05 DIAGNOSIS — I10 Essential (primary) hypertension: Secondary | ICD-10-CM | POA: Diagnosis not present

## 2023-09-05 DIAGNOSIS — M2550 Pain in unspecified joint: Secondary | ICD-10-CM | POA: Diagnosis not present

## 2023-09-05 DIAGNOSIS — I700 Atherosclerosis of aorta: Secondary | ICD-10-CM | POA: Diagnosis not present

## 2023-09-05 DIAGNOSIS — R079 Chest pain, unspecified: Secondary | ICD-10-CM | POA: Diagnosis not present

## 2023-09-05 DIAGNOSIS — Z5181 Encounter for therapeutic drug level monitoring: Secondary | ICD-10-CM | POA: Diagnosis not present

## 2023-09-05 DIAGNOSIS — Z0000 Encounter for general adult medical examination without abnormal findings: Secondary | ICD-10-CM | POA: Diagnosis not present

## 2023-09-05 DIAGNOSIS — M791 Myalgia, unspecified site: Secondary | ICD-10-CM | POA: Diagnosis not present

## 2023-09-05 DIAGNOSIS — E7800 Pure hypercholesterolemia, unspecified: Secondary | ICD-10-CM | POA: Diagnosis not present

## 2023-09-05 DIAGNOSIS — E1165 Type 2 diabetes mellitus with hyperglycemia: Secondary | ICD-10-CM | POA: Diagnosis not present

## 2023-09-05 DIAGNOSIS — Z8673 Personal history of transient ischemic attack (TIA), and cerebral infarction without residual deficits: Secondary | ICD-10-CM | POA: Diagnosis not present

## 2023-11-11 ENCOUNTER — Other Ambulatory Visit: Payer: Self-pay | Admitting: Internal Medicine

## 2023-11-11 DIAGNOSIS — Z1231 Encounter for screening mammogram for malignant neoplasm of breast: Secondary | ICD-10-CM

## 2023-11-20 NOTE — Progress Notes (Unsigned)
 No chief complaint on file.  History of Present Illness: 68 yo female with history of anxiety, aortic atherosclerosis, DM, HTN, GERD, prior CVA, HLD and IBS here today as a new consult, referred by Dr. Nehemiah Settle, for evaluation of chest pain. She was seen in our office in October 2018 by Dr. Eldridge Dace for chest pain and had a normal stress test in November 2018. Carotid artery dopplers in 2018 with mild bilateral carotid artery disease. She tells me today that she ***  Primary Care Physician: Renford Dills, MD   Past Medical History:  Diagnosis Date   Abnormal EKG    Anxiety    takes lexapro   Arthralgia    Atherosclerosis of aorta (HCC)    Carotid stenosis    Chest pain    Chronic interstitial cystitis    Complication of anesthesia    slow to awaken after surgery.   DM (diabetes mellitus) (HCC)    Elevated ferritin level    Elevated glucose    Elevated transaminase level    Essential hypertension    GERD (gastroesophageal reflux disease)    occ.   H/O endometritis    Hepatic steatosis    hepatomegaly   Hepatomegaly    History of CVA (cerebrovascular accident)    History of endometriosis    History of kidney stones 08/2013   x2 , 1 passed, 1 litho procedure   Hypercholesteremia    Hypertriglyceridemia    IBS (irritable bowel syndrome)    Irritable bowel syndrome    "pain left side"   Myalgia    Nephrolithiasis    Osteopenia of right hip    PONV (postoperative nausea and vomiting)    Renal calculus 2013   Vasomotor symptoms due to menopause    Vitamin D deficiency    Vitiligo     Past Surgical History:  Procedure Laterality Date   ABDOMINAL HYSTERECTOMY  1984   CATARACT EXTRACTION, BILATERAL Bilateral    COLONOSCOPY WITH PROPOFOL N/A 12/23/2015   Procedure: COLONOSCOPY WITH PROPOFOL;  Surgeon: Charolett Bumpers, MD;  Location: [facility];  Service: Endoscopy;  Laterality: N/A;   ESOPHAGOGASTRODUODENOSCOPY      Current Outpatient Medications  Medication Sig  Dispense Refill   cholecalciferol (VITAMIN D) 1000 units tablet Take 1,000 Units by mouth daily.     clidinium-chlordiazePOXIDE (LIBRAX) 5-2.5 MG capsule Take 1 capsule by mouth.     escitalopram (LEXAPRO) 10 MG tablet Take 5 mg by mouth every morning.      estrogens, conjugated, (PREMARIN) 1.25 MG tablet Take 1.25 mg by mouth daily.     glimepiride (AMARYL) 1 MG tablet      LORazepam (ATIVAN) 0.5 MG tablet Take 0.5 mg by mouth every 8 (eight) hours.     NEOMYCIN-POLYMYXIN-HYDROCORTISONE (CORTISPORIN) 1 % SOLN OTIC solution Apply 1-2 drops to toe BID after soaking 10 mL 1   traZODone (DESYREL) 50 MG tablet Take 25 mg by mouth at bedtime.      valsartan (DIOVAN) 80 MG tablet      vitamin E 400 UNIT capsule Take 800 Units by mouth daily.     No current facility-administered medications for this visit.    Allergies  Allergen Reactions   Amlodipine    Macrobid [Nitrofurantoin] Nausea And Vomiting   Metformin And Related    Sulfa Antibiotics     Upset stomach     Social History   Socioeconomic History   Marital status: Widowed    Spouse name: Not on file  Number of children: Not on file   Years of education: Not on file   Highest education level: Not on file  Occupational History   Not on file  Tobacco Use   Smoking status: Never   Smokeless tobacco: Never  Substance and Sexual Activity   Alcohol use: No   Drug use: No   Sexual activity: Not on file    Comment: widowed 2012  Other Topics Concern   Not on file  Social History Narrative   Pt lives alone in 1 story home   Has 1 son   High school graduate   Works in the Medical Records department of [employer]    Social Drivers of Health   Financial Resource Strain: Not on file  Food Insecurity: Not on file  Transportation Needs: Not on file  Physical Activity: Not on file  Stress: Not on file  Social Connections: Not on file  Intimate Partner Violence: Not on file    Family History  Problem Relation Age  of Onset   Breast cancer Neg Hx     Review of Systems:  As stated in the HPI and otherwise negative.   There were no vitals taken for this visit.  Physical Examination: General: Well developed, well nourished, NAD  HEENT: OP clear, mucus membranes moist  SKIN: warm, dry. No rashes. Neuro: No focal deficits  Musculoskeletal: Muscle strength 5/5 all ext  Psychiatric: Mood and affect normal  Neck: No JVD, no carotid bruits, no thyromegaly, no lymphadenopathy.  Lungs:Clear bilaterally, no wheezes, rhonci, crackles Cardiovascular: Regular rate and rhythm. No murmurs, gallops or rubs. Abdomen:Soft. Bowel sounds present. Non-tender.  Extremities: No lower extremity edema. Pulses are 2 + in the bilateral DP/PT.  EKG:  EKG {ACTION; IS/IS [id number]} ordered today. The ekg ordered today demonstrates ***  Recent Labs: No results found for requested labs within last 365 days.   Lipid Panel No results found for: "CHOL", "TRIG", "HDL", "CHOLHDL", "VLDL", "LDLCALC", "LDLDIRECT"   Wt Readings from Last 3 Encounters:  11/15/17 62.1 kg  08/02/17 62.3 kg  12/23/15 57.6 kg      Assessment and Plan:   1.   Labs/ tests ordered today include:  No orders of the defined types were placed in this encounter.    Disposition:   F/U with me in ***    Signed, Verne Carrow, MD, [facility] 11/20/2023 5:07 PM    [facility] [address] [place], [place], [place]  [address] Phone: [phone]; Fax: [phone]

## 2023-11-21 ENCOUNTER — Ambulatory Visit: Payer: Medicare Other | Attending: Cardiovascular Disease | Admitting: Cardiovascular Disease

## 2023-11-21 ENCOUNTER — Encounter: Payer: Self-pay | Admitting: Cardiovascular Disease

## 2023-11-21 VITALS — BP 134/74 | HR 84 | Ht 64.0 in | Wt 142.2 lb

## 2023-11-21 DIAGNOSIS — E7800 Pure hypercholesterolemia, unspecified: Secondary | ICD-10-CM

## 2023-11-21 DIAGNOSIS — R072 Precordial pain: Secondary | ICD-10-CM | POA: Diagnosis not present

## 2023-11-21 DIAGNOSIS — I10 Essential (primary) hypertension: Secondary | ICD-10-CM | POA: Diagnosis not present

## 2023-11-21 MED ORDER — METOPROLOL TARTRATE 100 MG PO TABS: 100.0000 mg | ORAL_TABLET | Freq: Once | ORAL | 0 refills | Status: AC

## 2023-11-21 NOTE — Patient Instructions (Addendum)
 Medication Instructions:  No changes *If you need a refill on your cardiac medications before your next appointment, please call your pharmacy*   Lab Work: Bmet prior to cardiac CT scan   Testing/Procedures: Your physician has requested that you have an echocardiogram. Echocardiography is a painless test that uses sound waves to create images of your heart. It provides your doctor with information about the size and shape of your heart and how well your heart's chambers and valves are working. This procedure takes approximately one hour. There are no restrictions for this procedure. Please do NOT wear cologne, perfume, aftershave, or lotions (deodorant is allowed). Please arrive 15 minutes prior to your appointment time.  Please note: We ask at that you not bring children with you during ultrasound (echo/ vascular) testing. Due to room size and safety concerns, children are not allowed in the ultrasound rooms during exams. Our front office staff cannot provide observation of children in our lobby area while testing is being conducted. An adult accompanying a patient to their appointment will only be allowed in the ultrasound room at the discretion of the ultrasound technician under special circumstances. We apologize for any inconvenience.  Your physician has requested that you have cardiac CT. Cardiac computed tomography (CT) is a painless test that uses an x-ray machine to take clear, detailed pictures of your heart. For further information please visit [email or web address] Please follow instruction sheet as given.     Follow-Up: At [facility], you and your health needs are our priority.  As part of our continuing mission to provide you with exceptional heart care, we have created designated Provider Care Teams.  These Care Teams include your primary Cardiologist (physician) and Advanced Practice Providers (APPs -  Physician Assistants and Nurse Practitioners) who all work together  to provide you with the care you need, when you need it.   Your next appointment:   12 month(s)  Provider:   Verne Carrow, MD       Your cardiac CT will be scheduled at [facility] [address] [place], [place] [address] [phone]  Please arrive at the [facility] and Children's Entrance (Entrance C2) of [facility] 30 minutes prior to test start time. You can use the FREE valet parking offered at entrance C (encouraged to control the heart rate for the test)  Proceed to the [facility] Radiology Department (first floor) to check-in and test prep.  All radiology patients and guests should use entrance C2 at [facility], accessed from [facility], even though the hospital's physical address listed is [address]     Please follow these instructions carefully (unless otherwise directed):  An IV will be required for this test and Nitroglycerin will be given.   On the Night Before the Test: Be sure to Drink plenty of water. Do not consume any caffeinated/decaffeinated beverages or chocolate 12 hours prior to your test. Do not take any antihistamines 12 hours prior to your test.  On the Day of the Test: Drink plenty of water until 1 hour prior to the test. Do not eat any food 1 hour prior to test. You may take your regular medications prior to the test.  Take metoprolol (Lopressor) two hours prior to test. Patients who wear a continuous glucose monitor MUST remove the device prior to scanning. FEMALES- please wear underwire-free bra if available, avoid dresses & tight clothing  After the Test: Drink plenty of water. After receiving IV contrast,  you may experience a mild flushed feeling. This is normal. On occasion, you may experience a mild rash up to 24 hours after the test. This is not dangerous. If this occurs, you can take Benadryl 25 mg, Zyrtec, Claritin, or Allegra and increase your fluid intake. (Patients  taking Tikosyn should avoid Benadryl, and may take Zyrtec, Claritin, or Allegra) If you experience trouble breathing, this can be serious. If it is severe call 911 IMMEDIATELY. If it is mild, please call our office.  We will call to schedule your test 2-4 weeks out understanding that some insurance companies will need an authorization prior to the service being performed.   For more information and frequently asked questions, please visit our website : [email or web address]  For non-scheduling related questions, please contact the cardiac imaging nurse navigator should you have any questions/concerns: Cardiac Imaging Nurse Navigators Direct Office Dial: [phone]   For scheduling needs, including cancellations and rescheduling, please call [place], [phone].  [facility] Contact for Alternative locations and appointment scheduling   [email or web address]   [email or web address]  [phone]

## 2023-11-23 DIAGNOSIS — R072 Precordial pain: Secondary | ICD-10-CM | POA: Diagnosis not present

## 2023-11-23 DIAGNOSIS — I10 Essential (primary) hypertension: Secondary | ICD-10-CM | POA: Diagnosis not present

## 2023-11-23 LAB — BASIC METABOLIC PANEL
BUN/Creatinine Ratio: 20 (ref 12–28)
BUN: 24 mg/dL (ref 8–27)
CO2: 24 mmol/L (ref 20–29)
Calcium: 10.6 mg/dL — ABNORMAL HIGH (ref 8.7–10.3)
Chloride: 97 mmol/L (ref 96–106)
Creatinine, Ser: 1.22 mg/dL — ABNORMAL HIGH (ref 0.57–1.00)
Glucose: 275 mg/dL — ABNORMAL HIGH (ref 70–99)
Potassium: 4 mmol/L (ref 3.5–5.2)
Sodium: 138 mmol/L (ref 134–144)
eGFR: 48 mL/min/{1.73_m2} — ABNORMAL LOW (ref >59)

## 2023-11-28 ENCOUNTER — Telehealth: Payer: Self-pay | Admitting: *Deleted

## 2023-11-28 NOTE — Telephone Encounter (Signed)
 I called the patient and let her know Dr. Clifton James reviewed the lab recent lab results.  Adv no further lab work is needed before CT scan.  Forwarded to PCP as her glucose is elevated 275 on that result.   All questions/concerns addressed during call.

## 2023-11-28 NOTE — Telephone Encounter (Signed)
-----   Message from Lorrin Jackson sent at 11/22/2023  1:57 PM EST ----- Regarding: labs Hey,  I just scheduled Seng for her cta. Can someone from the office contact her about her labs. She is confused and do not know where to do for them.  Thanks, [place]

## 2023-12-06 ENCOUNTER — Ambulatory Visit (HOSPITAL_COMMUNITY): Payer: Medicare Other

## 2023-12-13 ENCOUNTER — Ambulatory Visit (HOSPITAL_COMMUNITY): Payer: Medicare Other | Attending: Cardiology

## 2023-12-13 DIAGNOSIS — R072 Precordial pain: Secondary | ICD-10-CM | POA: Insufficient documentation

## 2023-12-13 LAB — ECHOCARDIOGRAM COMPLETE
Area-P 1/2: 2.73 cm2
Est EF: >75
S' Lateral: 1.9 cm

## 2023-12-16 ENCOUNTER — Encounter (HOSPITAL_COMMUNITY): Payer: Self-pay

## 2023-12-21 ENCOUNTER — Other Ambulatory Visit: Payer: Self-pay | Admitting: Cardiology

## 2023-12-21 ENCOUNTER — Ambulatory Visit (HOSPITAL_BASED_OUTPATIENT_CLINIC_OR_DEPARTMENT_OTHER)
Admission: RE | Admit: 2023-12-21 | Discharge: 2023-12-21 | Disposition: A | Discharge status: Home / Self Care | Source: Ambulatory Visit | Attending: Cardiology | Admitting: Cardiology

## 2023-12-21 ENCOUNTER — Ambulatory Visit (HOSPITAL_COMMUNITY)
Admission: RE | Admit: 2023-12-21 | Discharge: 2023-12-21 | Disposition: A | Discharge status: Home / Self Care | Source: Ambulatory Visit | Attending: Cardiovascular Disease | Admitting: Cardiovascular Disease

## 2023-12-21 DIAGNOSIS — R072 Precordial pain: Secondary | ICD-10-CM | POA: Diagnosis not present

## 2023-12-21 DIAGNOSIS — I2510 Atherosclerotic heart disease of native coronary artery without angina pectoris: Secondary | ICD-10-CM

## 2023-12-21 DIAGNOSIS — R931 Abnormal findings on diagnostic imaging of heart and coronary circulation: Secondary | ICD-10-CM

## 2023-12-21 MED ORDER — NITROGLYCERIN 0.4 MG SL SUBL
SUBLINGUAL_TABLET | SUBLINGUAL | Status: AC
  Filled 2023-12-21: qty 2

## 2023-12-21 MED ORDER — ONDANSETRON HCL 4 MG/2ML IJ SOLN
4.0000 mg | Freq: Once | INTRAMUSCULAR | Status: AC
  Administered 2023-12-21: 4 mg via INTRAVENOUS

## 2023-12-21 MED ORDER — NITROGLYCERIN 0.4 MG SL SUBL
0.8000 mg | SUBLINGUAL_TABLET | Freq: Once | SUBLINGUAL | Status: AC
  Administered 2023-12-21: 0.8 mg via SUBLINGUAL

## 2023-12-21 MED ORDER — IOHEXOL 350 MG/ML SOLN
95.0000 mL | Freq: Once | INTRAVENOUS | Status: AC | PRN
  Administered 2023-12-21: 95 mL via INTRAVENOUS

## 2023-12-21 MED ORDER — ONDANSETRON HCL 4 MG/2ML IJ SOLN
INTRAMUSCULAR | Status: AC
  Filled 2023-12-21: qty 2

## 2023-12-22 ENCOUNTER — Other Ambulatory Visit (HOSPITAL_COMMUNITY): Payer: Self-pay | Admitting: Emergency Medicine

## 2023-12-22 DIAGNOSIS — R931 Abnormal findings on diagnostic imaging of heart and coronary circulation: Secondary | ICD-10-CM

## 2023-12-29 ENCOUNTER — Ambulatory Visit
Admission: RE | Admit: 2023-12-29 | Discharge: 2023-12-29 | Disposition: A | Discharge status: Home / Self Care | Payer: Medicare Other | Source: Ambulatory Visit | Attending: Internal Medicine | Admitting: Internal Medicine

## 2023-12-29 DIAGNOSIS — Z1231 Encounter for screening mammogram for malignant neoplasm of breast: Secondary | ICD-10-CM | POA: Diagnosis not present

## 2024-01-24 DIAGNOSIS — Z961 Presence of intraocular lens: Secondary | ICD-10-CM | POA: Diagnosis not present

## 2024-01-24 DIAGNOSIS — H26493 Other secondary cataract, bilateral: Secondary | ICD-10-CM | POA: Diagnosis not present

## 2024-01-24 DIAGNOSIS — H52203 Unspecified astigmatism, bilateral: Secondary | ICD-10-CM | POA: Diagnosis not present

## 2024-01-24 DIAGNOSIS — E119 Type 2 diabetes mellitus without complications: Secondary | ICD-10-CM | POA: Diagnosis not present

## 2024-02-12 DIAGNOSIS — R310 Gross hematuria: Secondary | ICD-10-CM | POA: Diagnosis not present

## 2024-02-12 DIAGNOSIS — R8271 Bacteriuria: Secondary | ICD-10-CM | POA: Diagnosis not present

## 2024-02-12 DIAGNOSIS — R112 Nausea with vomiting, unspecified: Secondary | ICD-10-CM | POA: Diagnosis not present

## 2024-02-13 ENCOUNTER — Other Ambulatory Visit: Payer: Self-pay | Admitting: Urology

## 2024-02-13 DIAGNOSIS — R1031 Right lower quadrant pain: Secondary | ICD-10-CM | POA: Diagnosis not present

## 2024-02-13 DIAGNOSIS — N200 Calculus of kidney: Secondary | ICD-10-CM | POA: Diagnosis not present

## 2024-02-13 DIAGNOSIS — R1084 Generalized abdominal pain: Secondary | ICD-10-CM | POA: Diagnosis not present

## 2024-02-13 DIAGNOSIS — N1330 Unspecified hydronephrosis: Secondary | ICD-10-CM | POA: Diagnosis not present

## 2024-02-13 DIAGNOSIS — N3289 Other specified disorders of bladder: Secondary | ICD-10-CM | POA: Diagnosis not present

## 2024-02-15 ENCOUNTER — Encounter (HOSPITAL_COMMUNITY): Payer: Self-pay | Admitting: Urology

## 2024-02-15 NOTE — Progress Notes (Signed)
 Spoke w/ via phone for pre-op interview---Pt Lab needs dos---- KUB        Lab results------Pt saw Dr. Abel Hoe in Feb 2025 for SOB. EKG, Echocardiogram, chest CT in epic.  COVID test --Not indicated---patient states asymptomatic no test needed Arrive at -----0600 NPO after MN  Pre-Surgery Ensure or G2:  Med rec completed yes Medications to take morning of surgery ----Lexapro, Diovan- Diabetic medication --hold Amaryl am of procedure-  GLP1 agonist last dose: GLP1 instructions:  Patient instructed no nail polish to be worn day of surgery Patient instructed to bring photo id and insurance card day of surgery Patient aware to have Driver (ride ) / caregiver    for 24 hours after surgery - Friend Larene Pleasant- patient to being phone number day of procedure Patient Special Instructions ----- Pre-Op special Instructions -----Laxative of choice on Sunday, hydrate well, eat light dinner. Aspirin already stopped, do not take any NSAIDS or pepto bismol or supplements or vitamins after Friday 6/16. Other instructions per Lanis Pitcher / Litho  Patient verbalized understanding of instructions that were given at this phone interview. Patient denies chest pain, sob, fever, cough at the interview.

## 2024-02-20 ENCOUNTER — Encounter (HOSPITAL_COMMUNITY): Admission: RE | Payer: Self-pay | Source: Home / Self Care

## 2024-02-20 ENCOUNTER — Ambulatory Visit (HOSPITAL_COMMUNITY): Admission: RE | Admit: 2024-02-20 | Source: Home / Self Care | Admitting: Urology

## 2024-02-20 SURGERY — LITHOTRIPSY, ESWL
Anesthesia: LOCAL | Laterality: Right

## 2024-02-23 DIAGNOSIS — N132 Hydronephrosis with renal and ureteral calculous obstruction: Secondary | ICD-10-CM | POA: Diagnosis not present

## 2024-02-23 DIAGNOSIS — N202 Calculus of kidney with calculus of ureter: Secondary | ICD-10-CM | POA: Diagnosis not present

## 2024-02-23 DIAGNOSIS — R1084 Generalized abdominal pain: Secondary | ICD-10-CM | POA: Diagnosis not present

## 2024-03-05 DIAGNOSIS — I10 Essential (primary) hypertension: Secondary | ICD-10-CM | POA: Diagnosis not present

## 2024-03-05 DIAGNOSIS — I700 Atherosclerosis of aorta: Secondary | ICD-10-CM | POA: Diagnosis not present

## 2024-03-05 DIAGNOSIS — E113293 Type 2 diabetes mellitus with mild nonproliferative diabetic retinopathy without macular edema, bilateral: Secondary | ICD-10-CM | POA: Diagnosis not present

## 2024-03-05 DIAGNOSIS — Z8673 Personal history of transient ischemic attack (TIA), and cerebral infarction without residual deficits: Secondary | ICD-10-CM | POA: Diagnosis not present

## 2024-03-05 DIAGNOSIS — G720 Drug-induced myopathy: Secondary | ICD-10-CM | POA: Diagnosis not present

## 2024-03-05 DIAGNOSIS — E7800 Pure hypercholesterolemia, unspecified: Secondary | ICD-10-CM | POA: Diagnosis not present

## 2024-03-05 DIAGNOSIS — E11319 Type 2 diabetes mellitus with unspecified diabetic retinopathy without macular edema: Secondary | ICD-10-CM | POA: Diagnosis not present

## 2024-03-05 DIAGNOSIS — K760 Fatty (change of) liver, not elsewhere classified: Secondary | ICD-10-CM | POA: Diagnosis not present

## 2024-03-05 DIAGNOSIS — E1165 Type 2 diabetes mellitus with hyperglycemia: Secondary | ICD-10-CM | POA: Diagnosis not present

## 2024-03-26 DIAGNOSIS — N200 Calculus of kidney: Secondary | ICD-10-CM | POA: Diagnosis not present

## 2024-03-30 ENCOUNTER — Telehealth: Payer: Self-pay | Admitting: *Deleted

## 2024-03-30 ENCOUNTER — Telehealth: Payer: Self-pay | Admitting: Cardiovascular Disease

## 2024-03-30 ENCOUNTER — Other Ambulatory Visit: Payer: Self-pay | Admitting: Urology

## 2024-03-30 ENCOUNTER — Encounter (HOSPITAL_COMMUNITY): Payer: Self-pay | Admitting: Urology

## 2024-03-30 NOTE — Telephone Encounter (Signed)
 Pt has been scheduled tele preop appt 6/60/25 as add on due to procedure date and med hold. Med rec and consent are done.     Patient Consent for Virtual Visit        Annette Bartlett has provided verbal consent on 03/30/2024 for a virtual visit (video or telephone).   CONSENT FOR VIRTUAL VISIT FOR:  Annette Bartlett  By participating in this virtual visit I agree to the following:  I hereby voluntarily request, consent and authorize [facility] and its employed or contracted physicians, physician assistants, nurse practitioners or other licensed health care professionals (the Practitioner), to provide me with telemedicine health care services (the "Services) as deemed necessary by the treating Practitioner. I acknowledge and consent to receive the Services by the Practitioner via telemedicine. I understand that the telemedicine visit will involve communicating with the Practitioner through live audiovisual communication technology and the disclosure of certain medical information by electronic transmission. I acknowledge that I have been given the opportunity to request an in-person assessment or other available alternative prior to the telemedicine visit and am voluntarily participating in the telemedicine visit.  I understand that I have the right to withhold or withdraw my consent to the use of telemedicine in the course of my care at any time, without affecting my right to future care or treatment, and that the Practitioner or I may terminate the telemedicine visit at any time. I understand that I have the right to inspect all information obtained and/or recorded in the course of the telemedicine visit and may receive copies of available information for a reasonable fee.  I understand that some of the potential risks of receiving the Services via telemedicine include:  Delay or interruption in medical evaluation due to technological equipment failure or disruption; Information  transmitted may not be sufficient (e.g. poor resolution of images) to allow for appropriate medical decision making by the Practitioner; and/or  In rare instances, security protocols could fail, causing a breach of personal health information.  Furthermore, I acknowledge that it is my responsibility to provide information about my medical history, conditions and care that is complete and accurate to the best of my ability. I acknowledge that Practitioner's advice, recommendations, and/or decision may be based on factors not within their control, such as incomplete or inaccurate data provided by me or distortions of diagnostic images or specimens that may result from electronic transmissions. I understand that the practice of medicine is not an exact science and that Practitioner makes no warranties or guarantees regarding treatment outcomes. I acknowledge that a copy of this consent can be made available to me via my patient portal [facility] MyChart), or I can request a printed copy by calling the office of [facility].    I understand that my insurance will be billed for this visit.   I have read or had this consent read to me. I understand the contents of this consent, which adequately explains the benefits and risks of the Services being provided via telemedicine.  I have been provided ample opportunity to ask questions regarding this consent and the Services and have had my questions answered to my satisfaction. I give my informed consent for the services to be provided through the use of telemedicine in my medical care

## 2024-03-30 NOTE — Telephone Encounter (Signed)
   Pre-operative Risk Assessment    Patient Name: Annette Bartlett  DOB: 1956-05-04 MRN: [id number]   Date of last office visit: 11/21/23  Date of next office visit: due next Feb    Request for Surgical Clearance    Procedure:  Right Extracorporeal Shock Wave Lithotripsy   Date of Surgery:  Clearance 04/09/24                                Surgeon:  Dr. Devere  Surgeon's Group or Practice Name:  [facility]  Phone number:  [phone]  Fax number:  [phone]    Type of Clearance Requested:   - Pharmacy:  Hold Aspirin     Type of Anesthesia:  Local    Additional requests/questions:    [name]   03/30/2024, 12:29 PM

## 2024-03-30 NOTE — Telephone Encounter (Signed)
 Pt has been scheduled tele preop appt 6/60/25 as add on due to procedure date and med hold. Med rec and consent are done.

## 2024-03-30 NOTE — Telephone Encounter (Signed)
   Name: Annette Bartlett  DOB: 1955/11/25  MRN: [id number]  Primary Cardiologist: Lonni Cash, MD   Preoperative team, please contact this patient and set up a phone call appointment for further preoperative risk assessment. Please obtain consent and complete medication review. Last seen by Dr. Cash on 12/01/2023.Thank you for your help.  I confirm that guidance regarding antiplatelet and oral anticoagulation therapy has been completed and, if necessary, noted below.  Per office protocol, if patient is without any new symptoms or concerns at the time of their virtual visit, she may hold aspirin for 7 days prior to procedure. Please resume aspirin as soon as possible postprocedure, at the discretion of the surgeon.    I also confirmed the patient resides in the state of [place] . As per [facility] Medical Board telemedicine laws, the patient must reside in the state in which the provider is licensed.   Lamarr Satterfield, NP 03/30/2024, 3:22 PM [facility]

## 2024-04-02 ENCOUNTER — Encounter (HOSPITAL_COMMUNITY): Payer: Self-pay | Admitting: Urology

## 2024-04-02 ENCOUNTER — Ambulatory Visit: Attending: Cardiology

## 2024-04-02 DIAGNOSIS — Z01810 Encounter for preprocedural cardiovascular examination: Secondary | ICD-10-CM

## 2024-04-02 NOTE — Progress Notes (Signed)
 Virtual Visit via Telephone Note   Because of Annette Bartlett co-morbid illnesses, she is at least at moderate risk for complications without adequate follow up.  This format is felt to be most appropriate for this patient at this time.  Due to technical limitations with video connection (technology), today's appointment will be conducted as an audio only telehealth visit, and Annette Bartlett verbally agreed to proceed in this manner.   All issues noted in this document were discussed and addressed.  No physical exam could be performed with this format.  Evaluation Performed:  Preoperative cardiovascular risk assessment _____________   Date:  04/02/2024   Patient ID:  Annette Bartlett, DOB [birth year], MRN [id number] Patient Location:  Home Provider location:   Office  Primary Care Provider:  Rexanne Ingle, MD Primary Cardiologist:  Lonni Cash, MD  Chief Complaint / Patient Profile   68 y.o. y/o female with a h/o coronary calcifications, aortic atherosclerosis, DM type II, HTN, GERD, prior TIA, HLD, IBS who is pending extracorporal shockwave lithotripsy and presents today for telephonic preoperative cardiovascular risk assessment.  History of Present Illness    Annette Bartlett is a 68 y.o. female who presents via audio/video conferencing for a telehealth visit today.  Pt was last seen in cardiology clinic on 11/21/2023 by Dr. Cash.  At that time Annette Bartlett was seen for initial visit due to complaint of chest pain.  She completed a coronary CTA for further evaluation that showed calcium score of 559 with moderate stenosis in the LAD with FFR completed showing no significant stenosis.  She also completed a 2D echo that showed mild AV regurg with normal LV function and recommendation of follow-up in 2 years. The patient is now pending procedure as outlined above. Since her last visit, she has been doing well with no new cardiac complaints.  She is very functional and able to do  daily tasks without any chest discomfort or shortness of breath.    She denies chest pain, shortness of breath, lower extremity edema, fatigue, palpitations, melena, hematuria, hemoptysis, diaphoresis, weakness, presyncope, syncope, orthopnea, and PND.    Past Medical History    Past Medical History:  Diagnosis Date   Abnormal EKG    Anxiety    takes lexapro   Arthralgia    Atherosclerosis of aorta (HCC)    Carotid stenosis    Chest pain    Chronic interstitial cystitis    Complication of anesthesia    slow to awaken after surgery.   DM (diabetes mellitus) (HCC)    Elevated ferritin level    Elevated glucose    Elevated transaminase level    Essential hypertension    GERD (gastroesophageal reflux disease)    occ.   H/O endometritis    Hepatic steatosis    hepatomegaly   Hepatomegaly    History of CVA (cerebrovascular accident)    History of endometriosis    History of kidney stones 08/2013   x2 , 1 passed, 1 litho procedure   Hypercholesteremia    Hypertriglyceridemia    IBS (irritable bowel syndrome)    Irritable bowel syndrome    pain left side   Myalgia    Osteopenia of right hip    PONV (postoperative nausea and vomiting)    Vasomotor symptoms due to menopause    Vitamin D deficiency    Vitiligo    Past Surgical History:  Procedure Laterality Date   ABDOMINAL HYSTERECTOMY  1984   CATARACT EXTRACTION,  BILATERAL Bilateral    COLONOSCOPY WITH PROPOFOL  N/A 12/23/2015   Procedure: COLONOSCOPY WITH PROPOFOL ;  Surgeon: Gladis MARLA Louder, MD;  Location: [facility];  Service: Endoscopy;  Laterality: N/A;   ESOPHAGOGASTRODUODENOSCOPY      Allergies  Allergies  Allergen Reactions   Amlodipine    Macrobid [Nitrofurantoin] Nausea And Vomiting   Metformin And Related    Sulfa Antibiotics     Upset stomach     Home Medications    Prior to Admission medications   Medication Sig Start Date End Date Taking? Authorizing Provider  aspirin EC 81 MG tablet Take  81 mg by mouth daily. Swallow whole.    [provider]  cholecalciferol (VITAMIN D) 1000 units tablet Take 1,000 Units by mouth daily. Patient not taking: Reported on 03/30/2024    [provider]  clidinium-chlordiazePOXIDE (LIBRAX) 5-2.5 MG capsule Take 1 capsule by mouth.    [provider]  empagliflozin (JARDIANCE) 25 MG TABS tablet Take 25 mg by mouth daily. HAS NOT STARTED YET, PT WILL START ONCE SHE FINISHES 30 DAYS 10 MG DOSE 04/05/24   [provider]  escitalopram (LEXAPRO) 20 MG tablet Take 20 mg by mouth daily. 10/12/23   [provider]  estradiol (ESTRACE) 1 MG tablet Take 1 mg by mouth daily. 09/06/23   [provider]  glimepiride (AMARYL) 2 MG tablet Take 2 mg by mouth daily. 10/04/23   [provider]  JARDIANCE 10 MG TABS tablet Take 10 mg by mouth daily. 1 TABLET DAILY x 30 DAYS; THEN INCREASE TO 25 MG DAILY 03/06/24 04/04/24  [provider]  LORazepam (ATIVAN) 0.5 MG tablet Take 0.5 mg by mouth every 8 (eight) hours. Patient not taking: Reported on 03/30/2024    [provider]  metoprolol  tartrate (LOPRESSOR ) 100 MG tablet Take 1 tablet (100 mg total) by mouth once for 1 dose. Take 90-120 minutes prior to scan. 11/21/23 03/30/24  Verlin Lonni BIRCH, MD  NEOMYCIN -POLYMYXIN-HYDROCORTISONE (CORTISPORIN) 1 % SOLN OTIC solution Apply 1-2 drops to toe BID after soaking Patient not taking: Reported on 03/30/2024 07/03/19   Hyatt, Max T, DPM  REPATHA SURECLICK 140 MG/ML SOAJ Inject 140 mg into the skin. 11/15/23   [provider]  traZODone (DESYREL) 50 MG tablet Take 25 mg by mouth at bedtime.     [provider]  valsartan (DIOVAN) 160 MG tablet Take 160 mg by mouth daily. 03/09/24   [provider]  valsartan-hydrochlorothiazide (DIOVAN-HCT) 160-12.5 MG tablet Take 1 tablet by mouth daily. Patient not taking: Reported on 03/30/2024 10/22/23   [provider]  vitamin E 400 UNIT  capsule Take 800 Units by mouth daily. Patient not taking: Reported on 03/30/2024    [provider]    Physical Exam    Vital Signs:  Annette Bartlett does not have vital signs available for review today.  Given telephonic nature of communication, physical exam is limited. AAOx3. NAD. Normal affect.  Speech and respirations are unlabored.  Accessory Clinical Findings    None  Assessment & Plan    1.  Preoperative Cardiovascular Risk Assessment: - Patient's RCRI score is 0.9%  The patient affirms she has been doing well without any new cardiac symptoms. They are able to achieve 7 METS without cardiac limitations. Therefore, based on ACC/AHA guidelines, the patient would be at acceptable risk for the planned procedure without further cardiovascular testing. The patient was advised that if she develops new symptoms prior to surgery to contact  our office to arrange for a follow-up visit, and she verbalized understanding.   The patient was advised that if she develops new symptoms prior to surgery to contact our office to arrange for a follow-up visit, and she verbalized understanding.  Patient can hold ASA 81 mg 5 to 7 days prior to procedure  A copy of this note will be routed to requesting surgeon.  Time:   Today, I have spent 6 minutes with the patient with telehealth technology discussing medical history, symptoms, and management plan.     Wyn Raddle, Jackee Shove, NP  04/02/2024, 7:56 AM

## 2024-04-09 ENCOUNTER — Encounter (HOSPITAL_COMMUNITY): Payer: Self-pay | Admitting: Urology

## 2024-04-09 ENCOUNTER — Encounter (HOSPITAL_COMMUNITY)
Admission: RE | Disposition: A | Discharge status: Home / Self Care | Payer: Self-pay | Source: Home / Self Care | Attending: Urology

## 2024-04-09 ENCOUNTER — Other Ambulatory Visit: Payer: Self-pay

## 2024-04-09 ENCOUNTER — Ambulatory Visit (HOSPITAL_COMMUNITY)
Admission: RE | Admit: 2024-04-09 | Discharge: 2024-04-09 | Disposition: A | Discharge status: Home / Self Care | Attending: Urology | Admitting: Urology

## 2024-04-09 ENCOUNTER — Ambulatory Visit (HOSPITAL_COMMUNITY)

## 2024-04-09 DIAGNOSIS — E785 Hyperlipidemia, unspecified: Secondary | ICD-10-CM | POA: Insufficient documentation

## 2024-04-09 DIAGNOSIS — Z7984 Long term (current) use of oral hypoglycemic drugs: Secondary | ICD-10-CM | POA: Diagnosis not present

## 2024-04-09 DIAGNOSIS — E119 Type 2 diabetes mellitus without complications: Secondary | ICD-10-CM | POA: Diagnosis not present

## 2024-04-09 DIAGNOSIS — N200 Calculus of kidney: Secondary | ICD-10-CM | POA: Diagnosis not present

## 2024-04-09 DIAGNOSIS — K219 Gastro-esophageal reflux disease without esophagitis: Secondary | ICD-10-CM | POA: Diagnosis not present

## 2024-04-09 DIAGNOSIS — K589 Irritable bowel syndrome without diarrhea: Secondary | ICD-10-CM | POA: Diagnosis not present

## 2024-04-09 DIAGNOSIS — N2889 Other specified disorders of kidney and ureter: Secondary | ICD-10-CM | POA: Diagnosis not present

## 2024-04-09 DIAGNOSIS — Z7982 Long term (current) use of aspirin: Secondary | ICD-10-CM | POA: Diagnosis not present

## 2024-04-09 DIAGNOSIS — I10 Essential (primary) hypertension: Secondary | ICD-10-CM | POA: Insufficient documentation

## 2024-04-09 DIAGNOSIS — Z8673 Personal history of transient ischemic attack (TIA), and cerebral infarction without residual deficits: Secondary | ICD-10-CM | POA: Diagnosis not present

## 2024-04-09 DIAGNOSIS — I700 Atherosclerosis of aorta: Secondary | ICD-10-CM | POA: Insufficient documentation

## 2024-04-09 DIAGNOSIS — I2510 Atherosclerotic heart disease of native coronary artery without angina pectoris: Secondary | ICD-10-CM | POA: Diagnosis not present

## 2024-04-09 HISTORY — PX: EXTRACORPOREAL SHOCK WAVE LITHOTRIPSY: SHX1557

## 2024-04-09 LAB — GLUCOSE, CAPILLARY: Glucose-Capillary: 124 mg/dL — ABNORMAL HIGH (ref 70–99)

## 2024-04-09 SURGERY — LITHOTRIPSY, ESWL
Anesthesia: LOCAL | Laterality: Right

## 2024-04-09 MED ORDER — DIPHENHYDRAMINE HCL 25 MG PO CAPS
25.0000 mg | ORAL_CAPSULE | ORAL | Status: AC
  Administered 2024-04-09: 25 mg via ORAL
  Filled 2024-04-09: qty 1

## 2024-04-09 MED ORDER — CIPROFLOXACIN HCL 500 MG PO TABS
500.0000 mg | ORAL_TABLET | ORAL | Status: AC
  Administered 2024-04-09: 500 mg via ORAL
  Filled 2024-04-09: qty 1

## 2024-04-09 MED ORDER — SODIUM CHLORIDE 0.9 % IV SOLN: INTRAVENOUS | Status: DC

## 2024-04-09 MED ORDER — ONDANSETRON HCL 4 MG PO TABS: 4.0000 mg | ORAL_TABLET | Freq: Every day | ORAL | 1 refills | Status: AC | PRN

## 2024-04-09 MED ORDER — TRAMADOL HCL 50 MG PO TABS: 50.0000 mg | ORAL_TABLET | Freq: Four times a day (QID) | ORAL | 0 refills | Status: AC | PRN

## 2024-04-09 MED ORDER — DIAZEPAM 5 MG PO TABS
10.0000 mg | ORAL_TABLET | ORAL | Status: AC
  Administered 2024-04-09: 10 mg via ORAL
  Filled 2024-04-09: qty 2

## 2024-04-09 NOTE — Op Note (Signed)
[facility] Operative Note  Treating Physician: Ellison Hughs, MD  Pre-op diagnosis: 8 mm right renal stone  Post-op diagnosis: Same   Procedure: RIGHT ESWL  See Aris Everts OP note scanned into chart. Also because of the size, density, location and other factors that cannot be anticipated I feel this will likely be a staged procedure. This fact supersedes any indication in the scanned [place] [facility] operative note to the contrary

## 2024-04-09 NOTE — H&P (Signed)
See scanned [facility] documents for H&P.   

## 2024-04-10 ENCOUNTER — Encounter (HOSPITAL_COMMUNITY): Payer: Self-pay | Admitting: Urology

## 2024-04-26 DIAGNOSIS — N200 Calculus of kidney: Secondary | ICD-10-CM | POA: Diagnosis not present

## 2024-05-01 DIAGNOSIS — E1165 Type 2 diabetes mellitus with hyperglycemia: Secondary | ICD-10-CM | POA: Diagnosis not present

## 2024-11-26 ENCOUNTER — Ambulatory Visit: Admitting: Cardiovascular Disease
